# Patient Record
Sex: Female | Born: 1935 | Race: White | Hispanic: No | Marital: Married | Smoking: Never smoker
Health system: Southern US, Community
[De-identification: ages and names within clinical notes are randomized; demographics above are authoritative.]

## PROBLEM LIST (undated history)

## (undated) DIAGNOSIS — E079 Disorder of thyroid, unspecified: Secondary | ICD-10-CM

## (undated) DIAGNOSIS — H3530 Unspecified macular degeneration: Secondary | ICD-10-CM

## (undated) DIAGNOSIS — C801 Malignant (primary) neoplasm, unspecified: Secondary | ICD-10-CM

## (undated) DIAGNOSIS — E7800 Pure hypercholesterolemia, unspecified: Secondary | ICD-10-CM

## (undated) DIAGNOSIS — M1990 Unspecified osteoarthritis, unspecified site: Secondary | ICD-10-CM

## (undated) DIAGNOSIS — E039 Hypothyroidism, unspecified: Secondary | ICD-10-CM

## (undated) HISTORY — PX: INNER EAR SURGERY: SHX679

## (undated) HISTORY — PX: EYE SURGERY: SHX253

## (undated) HISTORY — PX: ABDOMINAL HYSTERECTOMY: SHX81

## (undated) HISTORY — PX: BREAST SURGERY: SHX581

---

## 1999-09-26 ENCOUNTER — Encounter: Payer: Self-pay | Admitting: Family Medicine

## 1999-09-26 ENCOUNTER — Encounter: Admission: RE | Admit: 1999-09-26 | Discharge: 1999-09-26 | Payer: Self-pay | Admitting: Family Medicine

## 2000-09-27 ENCOUNTER — Encounter: Payer: Self-pay | Admitting: Family Medicine

## 2000-09-27 ENCOUNTER — Encounter: Admission: RE | Admit: 2000-09-27 | Discharge: 2000-09-27 | Payer: Self-pay | Admitting: Family Medicine

## 2000-10-09 ENCOUNTER — Ambulatory Visit (HOSPITAL_COMMUNITY): Admission: RE | Admit: 2000-10-09 | Discharge: 2000-10-09 | Payer: Self-pay | Admitting: *Deleted

## 2001-09-29 ENCOUNTER — Encounter: Payer: Self-pay | Admitting: Family Medicine

## 2001-09-29 ENCOUNTER — Encounter: Admission: RE | Admit: 2001-09-29 | Discharge: 2001-09-29 | Payer: Self-pay | Admitting: Family Medicine

## 2001-10-02 ENCOUNTER — Encounter: Admission: RE | Admit: 2001-10-02 | Discharge: 2001-10-02 | Payer: Self-pay | Admitting: Family Medicine

## 2001-10-02 ENCOUNTER — Encounter: Payer: Self-pay | Admitting: Family Medicine

## 2002-05-15 ENCOUNTER — Encounter: Admission: RE | Admit: 2002-05-15 | Discharge: 2002-05-15 | Payer: Self-pay | Admitting: Family Medicine

## 2002-05-15 ENCOUNTER — Encounter: Payer: Self-pay | Admitting: Family Medicine

## 2002-10-07 ENCOUNTER — Encounter: Admission: RE | Admit: 2002-10-07 | Discharge: 2002-10-07 | Payer: Self-pay | Admitting: Family Medicine

## 2002-10-07 ENCOUNTER — Encounter: Payer: Self-pay | Admitting: Family Medicine

## 2003-10-08 ENCOUNTER — Encounter: Admission: RE | Admit: 2003-10-08 | Discharge: 2003-10-08 | Payer: Self-pay | Admitting: Family Medicine

## 2004-01-30 ENCOUNTER — Inpatient Hospital Stay (HOSPITAL_COMMUNITY): Admission: EM | Admit: 2004-01-30 | Discharge: 2004-02-02 | Payer: Self-pay | Admitting: Emergency Medicine

## 2004-02-01 ENCOUNTER — Encounter (INDEPENDENT_AMBULATORY_CARE_PROVIDER_SITE_OTHER): Payer: Self-pay | Admitting: Cardiology

## 2004-06-30 ENCOUNTER — Encounter: Admission: RE | Admit: 2004-06-30 | Discharge: 2004-06-30 | Payer: Self-pay | Admitting: Specialist

## 2004-07-03 ENCOUNTER — Encounter (INDEPENDENT_AMBULATORY_CARE_PROVIDER_SITE_OTHER): Payer: Self-pay | Admitting: Specialist

## 2004-07-03 ENCOUNTER — Ambulatory Visit (HOSPITAL_BASED_OUTPATIENT_CLINIC_OR_DEPARTMENT_OTHER): Admission: RE | Admit: 2004-07-03 | Discharge: 2004-07-03 | Payer: Self-pay | Admitting: Specialist

## 2004-07-03 ENCOUNTER — Ambulatory Visit (HOSPITAL_COMMUNITY): Admission: RE | Admit: 2004-07-03 | Discharge: 2004-07-03 | Payer: Self-pay | Admitting: Specialist

## 2004-10-13 ENCOUNTER — Encounter: Admission: RE | Admit: 2004-10-13 | Discharge: 2004-10-13 | Payer: Self-pay | Admitting: Family Medicine

## 2005-10-03 ENCOUNTER — Ambulatory Visit (HOSPITAL_COMMUNITY): Admission: RE | Admit: 2005-10-03 | Discharge: 2005-10-03 | Payer: Self-pay | Admitting: *Deleted

## 2005-10-03 ENCOUNTER — Encounter (INDEPENDENT_AMBULATORY_CARE_PROVIDER_SITE_OTHER): Payer: Self-pay | Admitting: *Deleted

## 2005-10-15 ENCOUNTER — Encounter: Admission: RE | Admit: 2005-10-15 | Discharge: 2005-10-15 | Payer: Self-pay | Admitting: Family Medicine

## 2006-10-17 ENCOUNTER — Encounter: Admission: RE | Admit: 2006-10-17 | Discharge: 2006-10-17 | Payer: Self-pay | Admitting: Family Medicine

## 2007-03-05 ENCOUNTER — Ambulatory Visit (HOSPITAL_COMMUNITY): Admission: RE | Admit: 2007-03-05 | Discharge: 2007-03-05 | Payer: Self-pay | Admitting: *Deleted

## 2007-03-05 ENCOUNTER — Encounter (INDEPENDENT_AMBULATORY_CARE_PROVIDER_SITE_OTHER): Payer: Self-pay | Admitting: *Deleted

## 2007-03-07 ENCOUNTER — Ambulatory Visit (HOSPITAL_COMMUNITY): Admission: RE | Admit: 2007-03-07 | Discharge: 2007-03-07 | Payer: Self-pay | Admitting: *Deleted

## 2007-10-20 ENCOUNTER — Encounter: Admission: RE | Admit: 2007-10-20 | Discharge: 2007-10-20 | Payer: Self-pay | Admitting: Family Medicine

## 2008-10-20 ENCOUNTER — Encounter: Admission: RE | Admit: 2008-10-20 | Discharge: 2008-10-20 | Payer: Self-pay | Admitting: Family Medicine

## 2009-10-31 ENCOUNTER — Encounter: Admission: RE | Admit: 2009-10-31 | Discharge: 2009-10-31 | Payer: Self-pay | Admitting: Family Medicine

## 2010-08-29 NOTE — Op Note (Signed)
[name], Kendra Owens               ACCOUNT NO.:  [id number]   MEDICAL RECORD NO.:  [id number]          PATIENT TYPE:  AMB   LOCATION:  ENDO                         FACILITY:  [facility]   PHYSICIAN:  Georgiana Spinner, M.D.    DATE OF BIRTH:  Sep 18, 1925   DATE OF PROCEDURE:  03/05/2007  DATE OF DISCHARGE:                               OPERATIVE REPORT   PROCEDURE:  Upper endoscopy.   INDICATIONS:  GI blood loss.   ANESTHESIA:  Fentanyl 50 mcg, Versed 3 mg.   PROCEDURE:  With the patient mildly sedated in the left lateral  decubitus position, the Pentax videoscopic endoscope was inserted and  passed under direct vision through the esophagus which appeared normal  until we reached distal esophagus and there was a questionable area  __________  of Barrett's esophagus up into the esophagus cephalad and  this was photographed and biopsied.  We entered into the stomach.  Fundus, body, antrum, duodenal bulb, second portion duodenum appeared  normal.  From this point the endoscope was slowly withdrawn taking  circumferential views of duodenal mucosa until the endoscope had been  pulled back into stomach placed in retroflexion to view the stomach from  below.  The endoscope was straightened and withdrawn taking  circumferential views remaining gastric and esophageal mucosa.  The  patient's vital signs and pulse oximeter remained stable.  The patient  tolerated procedure well without apparent complications.   FINDINGS:  Hiatal hernia with Barrett's esophagus biopsied but otherwise  an unremarkable examination.   PLAN:  Await biopsy report and proceed with capsule endoscopy and have  patient follow-up with me as an outpatient.           ______________________________  Georgiana Spinner, M.D.     GMO/MEDQ  D:  03/05/2007  T:  03/05/2007  Job:  [id number]   cc:   Tammy R. Collins Scotland, M.D.  Fax: [phone]

## 2010-09-01 NOTE — Op Note (Signed)
NAMEKITTI, MCCLISH NO.:  [id number]   MEDICAL RECORD NO.:  [id number]          PATIENT TYPE:  AMB   LOCATION:  ENDO                         FACILITY:  [facility]   PHYSICIAN:  Georgiana Spinner, M.D.    DATE OF BIRTH:  1925/11/30   DATE OF PROCEDURE:  10/03/2005  DATE OF DISCHARGE:                                 OPERATIVE REPORT   PROCEDURE:  Colonoscopy.   INDICATIONS:  Colon polyps.   ANESTHESIA:  None further given.   PROCEDURE:  With the patient mildly sedated in the left lateral decubitus  position, the Olympus videoscopic colonoscope was inserted in the rectum,  and with pressure applied to the abdomen, we reached the cecum identified by  ileocecal valve and appendiceal orifice.  Appendix appeared somewhat  retroverted, and this was photographed, and to be specific and clear, I  elected to do cold biopsy of this area.  From this point, the colonoscope  was then slowly withdrawn, taking circumferential views of colonic mucosa,  stopping only then in the rectum which appeared normal on direct and showed  hemorrhoids on retroflexed view.  The endoscope was straightened and  withdrawn.  The patient's vital signs and pulse oximeter remained stable.  The patient tolerated the procedure well without apparent complications.   FINDINGS:  Question of retroverted appendix versus polyp at the area of the  appendiceal orifice.  Await biopsy report.  The patient will call me for  results and follow-up with me as an outpatient.  See endoscopy note for  further details.           ______________________________  Georgiana Spinner, M.D.     GMO/MEDQ  D:  10/03/2005  T:  10/03/2005  Job:  [id number]

## 2010-09-01 NOTE — H&P (Signed)
NAMELAIKEN, SANDY NO.:  [id number]   MEDICAL RECORD NO.:  [id number]          PATIENT TYPE:  EMS   LOCATION:  MAJO                         FACILITY:  [facility]   PHYSICIAN:  Hillery Aldo, M.D.   DATE OF BIRTH:  1925/11/13   DATE OF ADMISSION:  01/30/2004  DATE OF DISCHARGE:                                HISTORY & PHYSICAL   CHIEF COMPLAINT:  Dizziness.   HISTORY OF PRESENT ILLNESS:  The patient is a 77 year old female with a past  medical history of vertigo who has had intermittent vertigo throughout her  life and who normally takes Dramamine for relief. She presents to the  hospital today with 24 to 36 hours of what started as  her usual vertigo,  but she became concerned when she developed some nausea and vomiting  associated with the vertigo. She has not had any relief from her usual dose  of Dramamine. The patient reports six episodes of emesis today. She denies  any associated chest pain, arrhythmia, shortness of breath, focal areas of  weakness, headache, dysarthria, or dysphagia. She initially presented to the  [facility] and during the evaluation there was found to have  sinus bradycardia with 42 beats per minute. She was sent to [facility] emergency department  for further evaluation and workup. She is  admitted today for rule out of a cardiac source for her current complaints  of vertigo.   PAST MEDICAL HISTORY:  1.  Vertigo.  2.  Hyperlipidemia.  3.  History of hysterectomy secondary to menorrhagia.  4.  History of ruptured  tympanic membrane and severe earaches as a child,      status post  tympanic membrane repair remotely.  5.  Decreased auditory acuity secondary to #4.  6.  Degenerative joint disease.   ALLERGIES:  PENICILLIN is voluntarily not taken secondary to personal choice  because she had experienced the death of a child secondary to complications  from a reaction to penicillin.   CURRENT MEDICATIONS:  1.   Lipitor 20 mg daily.  2.  Dramamine p.r.n.   SOCIAL HISTORY:  The patient is married and lives with her husband. She is  independent of all activities of daily living. She is a retired  Advertising account planner. She is a lifelong nonsmoker. She denies any  alcohol or drug use.   FAMILY HISTORY:  Father is deceased at age 52 secondary to complications of  COPD. The mother is deceased at age 40 secondary to colon cancer. She has  one sister who is in good health. She has two children also in good health.   REVIEW OF SYSTEMS:  CONSTITUTIONAL: No fever or chills. She does describe  intermittent hot flashes that occur with the dizzy spells. No weight loss.  HEENT: No diplopia or change in vision. She does have longstanding decreased  auditory acuity. No sore throat. CARDIOVASCULAR: No chest pain or  arrhythmia. RESPIRATORY: No shortness of breath or cough. GI: Nausea and  vomiting as per HPI. She uses stool softeners for lazy bowels. No melena  or hematochezia. She gets  colonoscopies regularly.  GU: No dysuria,  hematuria, frequency, or polyuria. MUSCULOSKELETAL:  The patient reports  occasional aches and pains secondary to arthritis. NEUROLOGIC: As per HPI.   PHYSICAL EXAMINATION:  VITAL SIGNS: Temperature 97.4, pulse 87, blood  pressure 114/65, respiratory rate 16, O2 saturation 96% on room air.  GENERAL: Well-developed, well-nourished female in moderate distress  secondary to ongoing vertigo.  HEENT: Normocephalic and atraumatic. Pupils are 3 mm and equally round,  reactive to light and accommodation. Extraocular movements are intact with  no nystagmus with no provoked extreme of lateral gaze. She did have some  occasional nystagmus without provoked gaze. Oropharynx is clear. She has dry  mucous membranes.  NECK: Supple, no thyromegaly, no lymphadenopathy, no jugular venous  distention or carotid bruits.  CHEST: Lungs clear to auscultation bilaterally with good air movement.  HEART:  Regular rate and rhythm. No murmurs, rubs, or gallops. Normal S1 and  S2.  ABDOMEN: Soft, nontender, nondistended with normoactive bowel sounds. No  organomegaly.  EXTREMITIES: No clubbing, cyanosis, or edema. There are 2+ dorsalis pedis  pulses bilaterally.  NEUROLOGIC: The patient is alert and oriented times three. Cranial nerves II-  XII are intact. She has normal strength in both upper and lower extremities.  She has 2+ symmetric reflexes in both upper and lower extremities. Toes are  downgoing bilaterally. Neurologic exam is nonfocal.   LABORATORY EVALUATION:  CBC shows a white blood cell count of 5.3,  hemoglobin 12.2, hematocrit 34.9, platelet count 112,000, MCV 89.5.  Chemistry showed a sodium of 142, potassium 4.5, chloride 111, bicarbonate  25, BUN 18, creatinine 1.0, glucose 107, calcium 9.1, total protein 6.2,  albumin 3.5, AST 24,  ALT 22, alkaline phosphatase 38, total bilirubin 0.8.  Urinalysis was negative except for small amount of leukocyte esterase.  Myoglobin 103, CK-MB 101, troponin less than 0.05.   RADIOGRAPHIC DATA:  CT scan of the head showed no acute findings. EKG showed  ST-segment depression in the inferolateral leads, normal sinus rhythm at 79  beats per minute. There are no old EKG tracings to compare.   ASSESSMENT/PLAN:  1.  Vertigo: Given the patient's history of present illness, this sounds      like a flair of her usual vertigo. I will treat her conservatively with      Valium for the vertigo and antiemetics for the nausea. The CT scan of      the head is negative for cerebellar pontine angle tumors.  Although I      doubt that this is a primarily a cardiovascular problem, given her      bradycardia that was noted at the urgent care, I think it is reasonable      to rule out acute coronary syndrome with serial enzymes and EKG. I will      place her empirically on aspirin. At this point, I doubt a stroke workup     would be helpful, but would consider  obtaining an MRA, MRI, 2-D      echocardiogram, and carotid Dopplers if her symptoms do not resolve with      conservative management. Additionally, she will be placed on fall      precautions and monitored on telemetry.  2.  Hyperlipidemia: We will check a fasting lipid panel and continue her      usual home dose of her Lipitor.  3.  Prophylaxis: The patient will be empirically treated with a  proton pump      inhibitor and  Lovenox to prevent GI ulceration and DVT.       CR/MEDQ  D:  01/30/2004  T:  01/30/2004  Job:  [id number]   cc:   Tammy R. Collins Scotland, M.D.  [address]  [place]  [place] [address]  Fax: [phone]

## 2010-09-01 NOTE — Procedures (Signed)
[facility]  Patient:    Kendra Owens, Kendra Owens                         MRN: [id number]. Date: 10/09/00 Attending:  Sabino Gasser, M.D.                           Procedure Report  PROCEDURE:  Colonoscopy.  INDICATIONS:  Colon polyps.  ANESTHESIA:  Demerol 50 mg, Versed 5 mg.  DESCRIPTION OF PROCEDURE:  With the patient mildly sedated in the left lateral decubitus position, the Olympus variable stiffness colonoscope was inserted into the rectum and passed under direct vision to the cecum, identified by the ileocecal valve and appendiceal orifice.  From this point, the colonoscope was slowly withdrawn taking circumferential views of the entire colonic mucosa stopping only then in the rectum which appeared normal on direct view and showed some small internal hemorrhoids on retroflexed view.  The endoscope was straightened and withdrawn.  The patients vital signs and pulse oximeter remained stable.  The patient tolerated the procedure well without apparent complications.  FINDINGS:  Internal hemorrhoids.  Otherwise unremarkable examination.  PLAN:  Repeat examination in five years. DD:  10/09/00 TD:  10/09/00 Job: [id number]

## 2010-09-01 NOTE — Op Note (Signed)
NAMESPARROW, SIRACUSA               ACCOUNT NO.:  [id number]   MEDICAL RECORD NO.:  [id number]          PATIENT TYPE:  AMB   LOCATION:  DSC                          FACILITY:  [facility]   PHYSICIAN:  Earvin Hansen L. Truesdale, M.D.DATE OF BIRTH:  1925-06-18   DATE OF PROCEDURE:  07/03/2004  DATE OF DISCHARGE:                                 OPERATIVE REPORT   HISTORY OF PRESENT ILLNESS:  This 77 year old lady has increased  blepharochalasis causing increased  heaviness of orbicularis,  headaches as  well as peripheral vision restrictions, right side greater than the left.  The patient states that she has used all types of drops in her eyes to  improve the areas with no avail.  Previous photographs were done,  preoperatively as well as visional field tests showing her severe  blepharochalasis.  Since the patient has failed conservative treatment for  this she is now being prepared for upper lid blepharoplasties to relieve the  symptoms.  All the procedures were explained to her in detail preoperatively  as well as attendant risks and possible complications.  She understands and  consents to surgery.   PROCEDURE:  Upper lid blepharoplasties.   ANESTHESIA:  Sedation MAC supplemented by 1% Xylocaine with epinephrine  1:100,000 concentration total of 10 cubic centimeters.   DESCRIPTION OF PROCEDURE:  The patient was taken the operating room table in  the supine position.  Was given adequate MAC anesthesia.  Preoperative was  done to the face and neck areas with Betadine solution, walled off with  sterile towels and draped to make a sterile field.  Saline was then used to  wipe down everything.  The eyes were protected with Lacri-Lube.  Preoperatively the patient had been set up and drawn for the supratarsal  fold.  Using the calibers we measured out approximately 1 cm to the mid  point and drew the rest of the drawings congruent with the excess skin.  It  was noticed that she had much more on the  right side laterally than on the  left side.  After this 1% Xylocaine was injected with epinephrine.  This  allowed to sit at least five minutes.  The incisions were made with  #15  blade removing the excess skin and out laterally some of the deeper tissue  down to the muscle.  A strip of orbicularis oculus was taken across the  whole supratarsal fold area.  The septum orbitale was entered.  Excess fat  pads removed from the upper lids as well as the medial compartments.  After  proper hemostasis the skin was then rearranged and reapproximated with a  running subcuticular stitch of 5-0 nylon from the medial canthal area over  to the lateral portion.  The rest of the lateral area was closed with  multiple sutures of 6-0 silk.  The same procedure was carried out on both  sides.  After being happy with the results the wounds were cleansed, the  eyes were irrigated with balanced salt solution.  Polysporin was placed over  the incisions and wet, cool compresses.  She withstood the procedures  very  well.  Estimated blood loss less than 20 cubic centimeters.   COMPLICATIONS:  None.      GLT/MEDQ  D:  07/03/2004  T:  07/03/2004  Job:  [id number]

## 2010-09-01 NOTE — Op Note (Signed)
NAMEPHILENA, OBEY NO.:  [id number]   MEDICAL RECORD NO.:  [id number]          PATIENT TYPE:  AMB   LOCATION:  ENDO                         FACILITY:  [facility]   PHYSICIAN:  Georgiana Spinner, M.D.    DATE OF BIRTH:  Jun 23, 1925   DATE OF PROCEDURE:  DATE OF DISCHARGE:                                 OPERATIVE REPORT   PROCEDURE:  Upper endoscopy.   INDICATIONS:  GERD.   ANESTHESIA:  Demerol 40, Versed 4 mg.   PROCEDURE:  With the patient mildly sedated in the left lateral decubitus  position, the Olympus videoscopic endoscope was inserted into the mouth and  passed under direct vision through the esophagus which appeared normal until  we reached the distal most esophagus and there were changes of esophagitis  and/or Barrett's esophagus.  Photographs and biopsies were taken.  We  entered into the stomach through a hiatal hernia.  Fundus, body, antrum,  duodenal bulb, second portion of duodenum were visualized and appeared  normal.  From this point, the endoscope was slowly withdrawn, taken  circumferential views of the duodenal mucosa until the endoscope had been  pulled back in the stomach and placed in retroflexion and viewed the stomach  from below.  The endoscope was then straightened and withdrawn, taking  circumferential views of the remaining gastric and esophageal mucosa.  The  patient's vital signs and pulse oximetry remained stable.  The patient  tolerated the procedure well without apparent complications.   FINDINGS:  Changes of esophagitis and/or Barrett's esophagus with biopsy.  Await biopsy reports.  The patient will call me for results and follow up  with me as an outpatient.  Proceed to colonoscopy as planned.           ______________________________  Georgiana Spinner, M.D.     GMO/MEDQ  D:  10/03/2005  T:  10/03/2005  Job:  [id number]

## 2010-09-01 NOTE — Discharge Summary (Signed)
Kendra Owens, Kendra Owens               ACCOUNT NO.:  [id number]   MEDICAL RECORD NO.:  [id number]          PATIENT TYPE:  INP   LOCATION:  4734                         FACILITY:  [facility]   PHYSICIAN:  Mobolaji B. Bakare, M.D.DATE OF BIRTH:  11-Sep-1925   DATE OF ADMISSION:  01/30/2004  DATE OF DISCHARGE:  02/02/2004                                 DISCHARGE SUMMARY   FINAL DIAGNOSES:  1.  Vertigo.  2.  Supraventricular tachycardia.  3.  Bradycardia.  4.  Thrombocytopenia.   SECONDARY DIAGNOSES:  1.  Hyperlipidemia.   PRIMARY CARE PHYSICIAN:  Tammy R. Collins Scotland, M.D.   CARDIOLOGIST:  Thereasa Solo. Little, M.D.   CHIEF COMPLAINT:  Dizziness.   BRIEF HISTORY:  Kendra Owens is a 77 year old female who has a past history  of vertigo.  She was admitted complaining of dizziness.  With typical  symptoms compatible with previous episodes of vertigo she has always had  vertigo all her life for which she uses Dramamine, but in this instance she  did not get relief from Dramamine.  There was associated emesis, however, no  other symptoms associated with these complaints.   Please refer to admission H&P for further history.   PERTINENT PHYSICAL FINDINGS:  On initial admission, vital signs:  Temperature of 97.4, pulse of 87, blood pressure of 114/65.  Respiratory  rate is 16.  O2 saturation is 96% on room air.  HEENT:  Pupils are equal and  nonreactive to light.  Extraocular muscle movements are intact with some  occasional nystagmus.  She was alert, oriented to time, place and person.  Neurological examination:  Cranial nerves were all intact and no  neurological deficits.  The rest of the systems were within normal.   PERTINENT LABORATORY FINDINGS:  CBC 5.3, hemoglobin 12.2, hematocrit 34.9,  platelets 112.  Sodium 142, potassium 4.5, chloride 111, bicarb 25, BUN 18,  creatinine 1.9, glucose 107, calcium 9.1, total protein 6.2, albumin 3.5,  AST 24, ALT 22, alkaline phosphatase 58, total bilirubin  0.8.  Urinalysis  was negative except for small amounts of leukocyte esterase.  TSH was 3.265,  normal.  Total cholesterol 41, HDL 59, LDL 70, triglycerides 62.  EKG showed  sinus bradycardia with a heart rate of 42 beats per minute and nonspecific  ST segment abnormality in V4 to V6.  A CT scan was normal.  No acute  findings.  A 2D echocardiogram showed normal left ventricular ejection  fraction of 55 to 65.  No significant valvular lesion.   HOSPITAL COURSE:  The patient was treated with Valium and Phenergan p.r.n.  and the vertigo subsequently resolved.  She was evaluated with telemetry and  on telemetry she was noted to have SVT with heart rate of 140.  This was  also evaluated by the cardiologist.  She only had one episode of a rhythm  strip with supraventricular tachycardia and it was felt that this may be  part of a sick sinus syndrome/tachy/brady syndrome.  She would follow up  with Dr. Clarene Duke in the office.  At this point, no recommendation for a rate  controlling medication because most times she is in sinus bradycardia.  She  did well and she was ambulated without difficulty by the physical therapist  and it was felt that the patient was stable to go home.  It was also noted  that initial blood work revealed mild thrombocytopenia.   DISCHARGE INSTRUCTIONS:  The patient should continue with home medications  and use Dramamine as needed for dizziness.  She should also use baby aspirin  once a day.   FOLLOW UP PLANS:  She should follow up with Dr. Clarene Duke, cardiologist in the  office as scheduled and she should schedule an appointment with ear, nose  and throat specialist for follow up of the vertigo and evaluation.  She  should follow up with Dr. Collins Scotland in 1 to 2 weeks' time and call for an  appointment.       MBB/MEDQ  D:  02/05/2004  T:  02/05/2004  Job:  [id number]   cc:   Tammy R. Collins Scotland, M.D.  [address]  [place]  [place] [address]  Fax: [phone]   Thereasa Solo.  Little, M.D.  [address]  [address]  [place]  [place] [address]  Fax: [phone]

## 2010-09-29 ENCOUNTER — Other Ambulatory Visit: Payer: Self-pay | Admitting: Family Medicine

## 2010-09-29 DIAGNOSIS — Z1231 Encounter for screening mammogram for malignant neoplasm of breast: Secondary | ICD-10-CM

## 2010-11-06 ENCOUNTER — Ambulatory Visit
Admission: RE | Admit: 2010-11-06 | Discharge: 2010-11-06 | Disposition: A | Discharge status: Home / Self Care | Payer: BC Managed Care – PPO | Source: Ambulatory Visit | Attending: Family Medicine | Admitting: Family Medicine

## 2010-11-06 DIAGNOSIS — Z1231 Encounter for screening mammogram for malignant neoplasm of breast: Secondary | ICD-10-CM

## 2010-11-07 ENCOUNTER — Ambulatory Visit: Payer: Self-pay

## 2011-10-08 ENCOUNTER — Other Ambulatory Visit: Payer: Self-pay | Admitting: Family Medicine

## 2011-10-08 DIAGNOSIS — Z1231 Encounter for screening mammogram for malignant neoplasm of breast: Secondary | ICD-10-CM

## 2011-11-09 ENCOUNTER — Ambulatory Visit
Admission: RE | Admit: 2011-11-09 | Discharge: 2011-11-09 | Disposition: A | Discharge status: Home / Self Care | Payer: BC Managed Care – PPO | Source: Ambulatory Visit | Attending: Family Medicine | Admitting: Family Medicine

## 2011-11-09 DIAGNOSIS — Z1231 Encounter for screening mammogram for malignant neoplasm of breast: Secondary | ICD-10-CM

## 2012-06-11 ENCOUNTER — Ambulatory Visit (INDEPENDENT_AMBULATORY_CARE_PROVIDER_SITE_OTHER): Payer: 59 | Admitting: *Deleted

## 2012-06-11 DIAGNOSIS — R0989 Other specified symptoms and signs involving the circulatory and respiratory systems: Secondary | ICD-10-CM

## 2013-05-25 ENCOUNTER — Emergency Department (HOSPITAL_COMMUNITY): Payer: Medicare Other

## 2013-05-25 ENCOUNTER — Encounter (HOSPITAL_COMMUNITY): Payer: Self-pay | Admitting: Emergency Medicine

## 2013-05-25 ENCOUNTER — Emergency Department (HOSPITAL_COMMUNITY)
Admission: EM | Admit: 2013-05-25 | Discharge: 2013-05-25 | Disposition: A | Discharge status: Home / Self Care | Payer: Medicare Other | Attending: Emergency Medicine | Admitting: Emergency Medicine

## 2013-05-25 DIAGNOSIS — Z792 Long term (current) use of antibiotics: Secondary | ICD-10-CM | POA: Insufficient documentation

## 2013-05-25 DIAGNOSIS — Z79899 Other long term (current) drug therapy: Secondary | ICD-10-CM | POA: Insufficient documentation

## 2013-05-25 DIAGNOSIS — R509 Fever, unspecified: Secondary | ICD-10-CM | POA: Insufficient documentation

## 2013-05-25 DIAGNOSIS — E079 Disorder of thyroid, unspecified: Secondary | ICD-10-CM | POA: Insufficient documentation

## 2013-05-25 DIAGNOSIS — B349 Viral infection, unspecified: Secondary | ICD-10-CM

## 2013-05-25 DIAGNOSIS — R630 Anorexia: Secondary | ICD-10-CM | POA: Insufficient documentation

## 2013-05-25 DIAGNOSIS — R059 Cough, unspecified: Secondary | ICD-10-CM | POA: Insufficient documentation

## 2013-05-25 DIAGNOSIS — R52 Pain, unspecified: Secondary | ICD-10-CM | POA: Insufficient documentation

## 2013-05-25 DIAGNOSIS — E7800 Pure hypercholesterolemia, unspecified: Secondary | ICD-10-CM | POA: Insufficient documentation

## 2013-05-25 DIAGNOSIS — R05 Cough: Secondary | ICD-10-CM | POA: Insufficient documentation

## 2013-05-25 DIAGNOSIS — B9789 Other viral agents as the cause of diseases classified elsewhere: Secondary | ICD-10-CM | POA: Insufficient documentation

## 2013-05-25 DIAGNOSIS — E860 Dehydration: Secondary | ICD-10-CM

## 2013-05-25 DIAGNOSIS — N390 Urinary tract infection, site not specified: Secondary | ICD-10-CM

## 2013-05-25 DIAGNOSIS — R0989 Other specified symptoms and signs involving the circulatory and respiratory systems: Secondary | ICD-10-CM | POA: Insufficient documentation

## 2013-05-25 DIAGNOSIS — Z8669 Personal history of other diseases of the nervous system and sense organs: Secondary | ICD-10-CM | POA: Insufficient documentation

## 2013-05-25 HISTORY — DX: Disorder of thyroid, unspecified: E07.9

## 2013-05-25 HISTORY — DX: Unspecified macular degeneration: H35.30

## 2013-05-25 HISTORY — DX: Pure hypercholesterolemia, unspecified: E78.00

## 2013-05-25 LAB — COMPREHENSIVE METABOLIC PANEL
ALT: 20 U/L (ref 0–35)
AST: 40 U/L — AB (ref 0–37)
Albumin: 3.4 g/dL — ABNORMAL LOW (ref 3.5–5.2)
Alkaline Phosphatase: 38 U/L — ABNORMAL LOW (ref 39–117)
BUN: 30 mg/dL — AB (ref 6–23)
CALCIUM: 8.9 mg/dL (ref 8.4–10.5)
CO2: 21 mEq/L (ref 19–32)
CREATININE: 1.19 mg/dL — AB (ref 0.50–1.10)
Chloride: 95 mEq/L — ABNORMAL LOW (ref 96–112)
GFR, EST AFRICAN AMERICAN: 47 mL/min — AB (ref >90)
GFR, EST NON AFRICAN AMERICAN: 40 mL/min — AB (ref >90)
Glucose, Bld: 95 mg/dL (ref 70–99)
Potassium: 4 mEq/L (ref 3.7–5.3)
SODIUM: 131 meq/L — AB (ref 137–147)
TOTAL PROTEIN: 7.1 g/dL (ref 6.0–8.3)
Total Bilirubin: 0.2 mg/dL — ABNORMAL LOW (ref 0.3–1.2)

## 2013-05-25 LAB — URINALYSIS, ROUTINE W REFLEX MICROSCOPIC
BILIRUBIN URINE: NEGATIVE
GLUCOSE, UA: NEGATIVE mg/dL
Hgb urine dipstick: NEGATIVE
KETONES UR: NEGATIVE mg/dL
Nitrite: NEGATIVE
PH: 5.5 (ref 5.0–8.0)
Protein, ur: 30 mg/dL — AB
SPECIFIC GRAVITY, URINE: 1.024 (ref 1.005–1.030)
Urobilinogen, UA: 0.2 mg/dL (ref 0.0–1.0)

## 2013-05-25 LAB — BASIC METABOLIC PANEL
BUN: 28 mg/dL — ABNORMAL HIGH (ref 6–23)
CHLORIDE: 101 meq/L (ref 96–112)
CO2: 22 meq/L (ref 19–32)
Calcium: 8 mg/dL — ABNORMAL LOW (ref 8.4–10.5)
Creatinine, Ser: 1.07 mg/dL (ref 0.50–1.10)
GFR calc non Af Amer: 46 mL/min — ABNORMAL LOW (ref >90)
GFR, EST AFRICAN AMERICAN: 53 mL/min — AB (ref >90)
Glucose, Bld: 109 mg/dL — ABNORMAL HIGH (ref 70–99)
POTASSIUM: 3.1 meq/L — AB (ref 3.7–5.3)
Sodium: 136 mEq/L — ABNORMAL LOW (ref 137–147)

## 2013-05-25 LAB — CBC WITH DIFFERENTIAL/PLATELET
Basophils Absolute: 0 10*3/uL (ref 0.0–0.1)
Basophils Relative: 0 % (ref 0–1)
Eosinophils Absolute: 0 10*3/uL (ref 0.0–0.7)
Eosinophils Relative: 0 % (ref 0–5)
HCT: 36.4 % (ref 36.0–46.0)
Hemoglobin: 12.6 g/dL (ref 12.0–15.0)
Lymphocytes Relative: 28 % (ref 12–46)
Lymphs Abs: 1.3 10*3/uL (ref 0.7–4.0)
MCH: 30.5 pg (ref 26.0–34.0)
MCHC: 34.6 g/dL (ref 30.0–36.0)
MCV: 88.1 fL (ref 78.0–100.0)
MONO ABS: 0.7 10*3/uL (ref 0.1–1.0)
MONOS PCT: 14 % — AB (ref 3–12)
NEUTROS ABS: 2.8 10*3/uL (ref 1.7–7.7)
NEUTROS PCT: 58 % (ref 43–77)
PLATELETS: 100 10*3/uL — AB (ref 150–400)
RBC: 4.13 MIL/uL (ref 3.87–5.11)
RDW: 13.6 % (ref 11.5–15.5)
WBC: 4.8 10*3/uL (ref 4.0–10.5)

## 2013-05-25 LAB — CBC
HEMATOCRIT: 34.2 % — AB (ref 36.0–46.0)
Hemoglobin: 11.5 g/dL — ABNORMAL LOW (ref 12.0–15.0)
MCH: 29.6 pg (ref 26.0–34.0)
MCHC: 33.6 g/dL (ref 30.0–36.0)
MCV: 88.1 fL (ref 78.0–100.0)
PLATELETS: 87 10*3/uL — AB (ref 150–400)
RBC: 3.88 MIL/uL (ref 3.87–5.11)
RDW: 13.8 % (ref 11.5–15.5)
WBC: 3.4 10*3/uL — AB (ref 4.0–10.5)

## 2013-05-25 LAB — URINE MICROSCOPIC-ADD ON

## 2013-05-25 LAB — TROPONIN I
Troponin I: <0.3 ng/mL (ref <0.30)
Troponin I: <0.3 ng/mL (ref <0.30)

## 2013-05-25 LAB — CG4 I-STAT (LACTIC ACID): LACTIC ACID, VENOUS: 1.11 mmol/L (ref 0.5–2.2)

## 2013-05-25 MED ORDER — CIPROFLOXACIN HCL 500 MG PO TABS: 500.0000 mg | ORAL_TABLET | Freq: Two times a day (BID) | ORAL | Status: DC

## 2013-05-25 MED ORDER — DEXTROSE 5 % IV SOLN
1.0000 g | Freq: Once | INTRAVENOUS | Status: AC
  Administered 2013-05-25: 1 g via INTRAVENOUS
  Filled 2013-05-25: qty 10

## 2013-05-25 MED ORDER — POTASSIUM CHLORIDE CRYS ER 20 MEQ PO TBCR
40.0000 meq | EXTENDED_RELEASE_TABLET | Freq: Once | ORAL | Status: AC
  Administered 2013-05-25: 40 meq via ORAL
  Filled 2013-05-25: qty 2

## 2013-05-25 MED ORDER — SODIUM CHLORIDE 0.9 % IV BOLUS (SEPSIS)
1000.0000 mL | Freq: Once | INTRAVENOUS | Status: AC
  Administered 2013-05-25: 1000 mL via INTRAVENOUS

## 2013-05-25 MED ORDER — POTASSIUM CHLORIDE CRYS ER 20 MEQ PO TBCR: 20.0000 meq | EXTENDED_RELEASE_TABLET | Freq: Every day | ORAL | Status: DC

## 2013-05-25 NOTE — Progress Notes (Signed)
   CARE MANAGEMENT ED NOTE 05/25/2013  Patient:  Kendra Owens, Kendra Owens   Account Number:  [id number]  Date Initiated:  05/25/2013  Documentation initiated by:  Livia Snellen  Subjective/Objective Assessment:   Patient presents to ED sent in from pcp office for questionable dehydration.     Subjective/Objective Assessment Detail:   Patient withhistory of thyroid disease high cholesterol and macular degeneration.     Action/Plan:   Action/Plan Detail:   Anticipated DC Date:       Status Recommendation to Physician:   Result of Recommendation:    Other ED Services  Consult Working [place]  Other    Choice offered to / List presented to:            Status of service:  Completed, signed off  ED Comments:   ED Comments Detail:  EDCM spoke to patient at bedside.  Patient lives at home by herself.  Patient's daughter Rose Fillers [phone] lives right next door to patient.  Patient does not have any medical equipment at home.  Patient reports she has had [place] come out for her husband when he was sick.  Patient reports she can perform her own ADL's without difficulty.  Patient reports she does all of her own yard work as well.  Patient confirms he pcp is Dr. Charleston Poot.  EDCM provided patient with a list of home health agencies in [facility]  Also provided patient with a list of private duty nursing services.  Patient and patient's daughter both agree that patient will not be requiring home health services at this  time.  Patient thankful for resources.  No further EDCM needs at thiss time.

## 2013-05-25 NOTE — ED Notes (Signed)
Pt to ED from PCP office with dehydration.  Pt reports flu since last Thursday 05/21/13.  Pt daughter states PCP recommended pt to come to ED for IV fluids and a chest xray.  Denies chest pain, SOB, or fever.  Pt c/o generalized weakness.

## 2013-05-25 NOTE — Discharge Instructions (Signed)
Please call your doctor for a followup appointment within 24-48 hours. When you talk to your doctor please let them know that you were seen in the emergency department and have them acquire all of your records so that they can discuss the findings with you and formulate a treatment plan to fully care for your new and ongoing problems. Please call primary care provider first thing tomorrow morning to be re-assessed. Must be re-assessed by primary doctor within 24-48 hours. Blood work and urine will need to be re-checked Please rest and stay hydrated Please take medications as prescribed - Ciprofloxacin for urinary tract infection Please drink plenty of fluids Please continue to monitor symptoms closely and if symptoms are to worsen or change (fever greater than 101, chills, sweating, nausea, vomiting, diarrhea, stomach pain, chest pain, shortness of breath, difficulty breathing, urine decreased, inability to tolerate fluids) please report back to the ED immediately  Urinary Tract Infection Urinary tract infections (UTIs) can develop anywhere along your urinary tract. Your urinary tract is your body's drainage system for removing wastes and extra water. Your urinary tract includes two kidneys, two ureters, a bladder, and a urethra. Your kidneys are a pair of bean-shaped organs. Each kidney is about the size of your fist. They are located below your ribs, one on each side of your spine. CAUSES Infections are caused by microbes, which are microscopic organisms, including fungi, viruses, and bacteria. These organisms are so small that they can only be seen through a microscope. Bacteria are the microbes that most commonly cause UTIs. SYMPTOMS  Symptoms of UTIs may vary by age and gender of the patient and by the location of the infection. Symptoms in young women typically include a frequent and intense urge to urinate and a painful, burning feeling in the bladder or urethra during urination. Older women and  men are more likely to be tired, shaky, and weak and have muscle aches and abdominal pain. A fever may mean the infection is in your kidneys. Other symptoms of a kidney infection include pain in your back or sides below the ribs, nausea, and vomiting. DIAGNOSIS To diagnose a UTI, your caregiver will ask you about your symptoms. Your caregiver also will ask to provide a urine sample. The urine sample will be tested for bacteria and white blood cells. White blood cells are made by your body to help fight infection. TREATMENT  Typically, UTIs can be treated with medication. Because most UTIs are caused by a bacterial infection, they usually can be treated with the use of antibiotics. The choice of antibiotic and length of treatment depend on your symptoms and the type of bacteria causing your infection. HOME CARE INSTRUCTIONS  If you were prescribed antibiotics, take them exactly as your caregiver instructs you. Finish the medication even if you feel better after you have only taken some of the medication.  Drink enough water and fluids to keep your urine clear or pale yellow.  Avoid caffeine, tea, and carbonated beverages. They tend to irritate your bladder.  Empty your bladder often. Avoid holding urine for long periods of time.  Empty your bladder before and after sexual intercourse.  After a bowel movement, women should cleanse from front to back. Use each tissue only once. SEEK MEDICAL CARE IF:   You have back pain.  You develop a fever.  Your symptoms do not begin to resolve within 3 days. SEEK IMMEDIATE MEDICAL CARE IF:   You have severe back pain or lower abdominal pain.  You develop chills.  You have nausea or vomiting.  You have continued burning or discomfort with urination. MAKE SURE YOU:   Understand these instructions.  Will watch your condition.  Will get help right away if you are not doing well or get worse. Document Released: 01/10/2005 Document Revised:  10/02/2011 Document Reviewed: 05/11/2011 [facility] Patient Information 2014 [place].   Emergency Department Resource Guide 1) Find a Doctor and Pay Out of Pocket Although you won't have to find out who is covered by your insurance plan, it is a good idea to ask around and get recommendations. You will then need to call the office and see if the doctor you have chosen will accept you as a new patient and what types of options they offer for patients who are self-pay. Some doctors offer discounts or will set up payment plans for their patients who do not have insurance, but you will need to ask so you aren't surprised when you get to your appointment.  2) Contact Your Local Health Department Not all health departments have doctors that can see patients for sick visits, but many do, so it is worth a call to see if yours does. If you don't know where your local health department is, you can check in your phone book. The CDC also has a tool to help you locate your state's health department, and many state websites also have listings of all of their local health departments.  3) Find a [facility] If your illness is not likely to be very severe or complicated, you may want to try a walk in clinic. These are popping up all over the country in pharmacies, drugstores, and shopping centers. They're usually staffed by nurse practitioners or physician assistants that have been trained to treat common illnesses and complaints. They're usually fairly quick and inexpensive. However, if you have serious medical issues or chronic medical problems, these are probably not your best option.  No Primary Care Doctor: - Call Health Connect at  [phone] - they can help you locate a primary care doctor that  accepts your insurance, provides certain services, etc. - Physician Referral Service- [phone]  Chronic Pain Problems: Organization         Address  Phone   Notes  [facility]   [phone] Patients need to be referred by their primary care doctor.   Medication Assistance: Organization         Address  Phone   Notes  [facility] Medication [facility] [address] [place], [place] [address] [phone] --Must be a resident of [facility] -- Must have NO insurance coverage whatsoever (no Medicaid/ Medicare, etc.) -- The pt. MUST have a primary care doctor that directs their care regularly and follows them in the community   [facility]ist  [phone]   [employer]  [phone]    Agencies that provide inexpensive medical care: Organization         Address  Phone   Notes  [facility]  [phone]   [facility]    [phone]   [facility] [place],  [phone]   [place] [address] [place] [phone]   Planned Parenthood    [phone]   [facility]    [phone]   [place] and [place] [address] [place], [employer] Phone:  [phone], Fax:  [phone] Hours of Operation:  9 am - 6 pm, M-F.  Also accepts Medicaid/Medicare and self-pay.  [facility]  [address], [place] Phone: [phone], Fax: [phone]. Hours of Operation:  8:30 am - 5:30 pm, M-F.  Also accepts Medicaid and self-pay.  [facility] [place] [address], [place] Phone: [phone]   [facility] [address] Natasha Bence [place], [place] [phone], Ext. [phone] Mondays & Thursdays: 7-9 AM.  First 15 patients are seen on a first come, first serve basis.    Medicaid-accepting [facility] Providers:  Organization         Address  Phone   Notes  [facility] [place] [facility] [address], [place] [phone] Also accepts self-pay patients.  [facility] [address] Laurell Josephs [place],  [place]  [phone]   [facility] [address], [place] [phone]   [facility] [address], [place] [phone]   Renaye Rakers [address], [place]   [phone] Only accepts [place] Access [place] patients after they have their name applied to their card.   Self-Pay (no insurance) in [facility]:  Organization         Address  Phone   Notes  Sickle Cell Patients, [facility] [address] [place], [place] [phone]   [facility] [address] [place], [place] [phone]   [facility] [place]  [address], [place] [phone]   [facility]/Dr. Osei-Bonsu  [address], [place] or [address], [place] [phone] Phone number for both [place] and [place] locations is the same.  [facility] [address], [place] [phone]   [facility] [address] [place] or [address] [phone]   [facility] [address], [place] [phone], phone; [phone], fax Sees patients 1st and 3rd Saturday of every month.  Must not qualify for public or private insurance (i.e. Medicaid, Medicare, [place] Health Choice, Veterans' Benefits)  Household income should be no more than 200% of the poverty level The clinic cannot treat you if you are pregnant or think you are pregnant  Sexually transmitted diseases are not treated at the clinic.    Dental Care: Organization         Address  Phone  Notes  [facility] of [facility] [address] [place], [place] [phone] Accepts children up to age 17 who are enrolled in [place] or [place] Health Choice; pregnant women with a Medicaid card; and children who have applied for Medicaid or [place] Health  Choice, but were declined, whose parents can pay a reduced fee at time of service.  [facility]  [address], [place] [phone] Accepts children up to age 48 who are enrolled in [place] or [place] Health Choice; pregnant women with a Medicaid card; and children who have applied for Medicaid or [place] Health Choice, but were declined, whose parents can pay a reduced fee at time of service.  [facility]  [address] [place], [place] [phone] Patients are seen by appointment only. Walk-ins  are not accepted. [facility] will see patients 78 years of age and older. Monday - Tuesday (8am-5pm) Most Wednesdays (8:30-5pm) $30 per visit, cash only  [facility]  [address], [facility] [phone] Patients are seen by appointment only. Walk-ins are not accepted. [facility] will see patients 78 years of age and older. One Wednesday Evening (Monthly: Volunteer Based).  $30 per visit, cash only  [employer] [facility]  [phone] for adults; Children under age 124, call Graduate Pediatric Dentistry at [phone]. Children aged 664-14, please call [phone] to request a pediatric application.  Dental services are provided in all areas of dental care including fillings, crowns and bridges, complete and partial dentures, implants, gum treatment, root canals, and extractions. Preventive care is also provided. Treatment is provided to both adults and children. Patients are selected via a lottery and there is often a waiting list.   [facility] [address], [place]  [phone] [email or web address]   [facility] [address], [place], [place] [phone], Ext. [phone] Second and Fourth Thursday of each month, opens at 6:30 AM; Clinic ends at 9 AM.  Patients are seen on a first-come first-served basis, and a limited number are seen during each  clinic.   [facility]  [address], [place], [place] [phone]   Eligibility Requirements You must have lived in [place], [place], or [place] counties for at least the last three months.   You cannot be eligible for state or federal sponsored [place] Cityhealthcare insurance, including CIG[employer]Veterans Administration, [place]icaid, or [employer]icare.   You generally cannot be eligible for healthcare insurance through your employer.    How to apply: Eligibility screenings are held every Tuesday and Wednesday afternoon from 1:00 pm until 4:00 pm. You do not need an appointment for the interview!  [facility] [address], [place], [place] [phone]   [facility]  [phone]   [facility]  [phone]   [facility]  [phone]    Behavioral Health Resources in the Community: Intensive Outpatient Programs Organization         Address  Phone  Notes  [facility] [address], [place], [place] [phone]   [facility] [address], [place], [place] [phone]   [facility] [address], [place], [place]  [phone]   [facility] [address],  [place], [place] [phone] or [phone]   Substance Abuse Resources Organization         Address  Phone  Notes  [facility]  [phone]   [facility]  [phone]   [facility]  [phone]   [facility]  [phone]   Residential & Outpatient Substance Abuse Program  [phone]   Psychological Services Organization         Address  Phone  Notes  [facility]  [phone]- [phone]   [facility]  [phone]- [phone]   [facility] [address], [place] [phone] or [phone]    Mobile Crisis Teams Organization         Address  Phone  Notes  [facility], Mobile  Crisis Care Unit  [phone]   [facility]  [address] [place], [place] [phone]   [facility] [address] [place] [phone]  Self-Help/Support Groups Organization         Address  Phone             Notes  [facility] [place] - variety of support groups  [place] Call for more information  Narcotics Anonymous (NA), [facility] [address], [employer] Dewy Rose  2 meetings at this location   Special educational needs teacher         Address  Phone  Notes  [facility] [place],    [place]  [phone]   [facility]  [address], [place] [address], Circle, [place]   [place]otia Kirkman, [place] [phone] Admissions: 8am-3pm M-F  Incentives Substance [place] [address],    [place], [place] [phone]   [facility] [address] [place], [place], [place]   [facility] [address]  [place], [place]   [facility] - Intensive Outpatient [address] Kristeen Mans [address], [place], [place]   [facility] ([place] [address].,  [place], [place] [phone] or [phone]   [facility] ([facility]) [address] [place], [place] Accepts Medicaid  [facility] [place] [address]  [place] [phone] Substance Abuse/Addiction Treatment   [facility] Organization         Address  Phone  Notes  [facility]  [phone]   Domenic Schwab, PhD [address] Arlis Porta [place], [place]   [phone] or [phone]   [place], [place] [phone]   [facility] [address] [place], [place] [phone] Insurance/Medicaid/sponsorship through [facility] Families [address]                                    [place], [place] [phone] [place] [address], [place] [phone]    Dr. Adele Schilder  [phone]   [facility] Dept. 1) [address], [place] 2) [place] 3)  [place] [address], [place] [phone]  [phone]   [place] [phone] or [phone] (After Hours)

## 2013-05-25 NOTE — ED Provider Notes (Signed)
CSN: [id number]     Arrival date & time 05/25/13  1404 History   None    Chief Complaint  Patient presents with  . Dehydration     (Consider location/radiation/quality/duration/timing/severity/associated sxs/prior Treatment) The history is provided by the patient and a relative. No language interpreter was used.  Kendra Owens is an 80 y/o F with PMHx of thyroid disease, high cholesterol, macular degeneration presenting to the ED with daughter regarding chills, bodyaches, cough, chest congestion, and subjective fever that came on abruptly Thursday. Patient reported that she has been coughing up mild amount of phlegm. Stated that she has been feeling feverish with sweats, but stated that she has not actually taken her temperature. Reported that she has been having decreased appetite - has not been drinking fluids as much due to feeling nauseous. Stated that she has been feeling weak and dizzy for the past couple of days due to not eating. Reported that she stayed at her friend's room in an assisted living center Sunday into Tuesday - concerned that she got the flu or pneumonia from the facility. Patient stated that she has been using Mucinex with minimal relief. Reported that she was seen and assessed by her PCP this afternoon who recommended the patient to go to the ED to get fluids and chest xray. Patient was diagnosed with thrush yesterday and has been on Nystatin swish and spit - used 4 times with improvement. Denied nasal congestion, chest pain, shortness of breath, difficulty breathing, headache, sore throat, difficulty swallowing, diarrhea, vomiting, melena, hematochezia, abdominal pain, facial pressure, sudden loss of vision, AMS, changes to behavior/personality. PCP Dr. Darene Lamer. [name]  Past Medical History  Diagnosis Date  . Thyroid disease   . High cholesterol   . Macular degeneration    Past Surgical History  Procedure Laterality Date  . Abdominal hysterectomy    . Inner ear surgery     . Breast surgery     History reviewed. No pertinent family history. History  Substance Use Topics  . Smoking status: Never Smoker   . Smokeless tobacco: Not on file  . Alcohol Use: No   OB History   Grav Para Term Preterm Abortions TAB SAB Ect Mult Living                 Review of Systems  Constitutional: Positive for fever (subjective), chills and appetite change (decreased).  HENT: Negative for congestion, rhinorrhea, sore throat and trouble swallowing.   Respiratory: Positive for cough. Negative for chest tightness and shortness of breath.   Cardiovascular: Negative for chest pain.  Gastrointestinal: Positive for nausea. Negative for vomiting, abdominal pain, diarrhea, constipation, blood in stool and anal bleeding.  Musculoskeletal: Positive for myalgias. Negative for arthralgias and back pain.  Neurological: Positive for dizziness and weakness. Negative for headaches.  All other systems reviewed and are negative.      Allergies  Review of patient's allergies indicates no known allergies.  Home Medications   Current Outpatient Rx  Name  Route  Sig  Dispense  Refill  . beta carotene w/minerals (OCUVITE) tablet   Oral   Take 1 tablet by mouth daily.         Marland Kitchen levothyroxine (SYNTHROID, LEVOTHROID) 25 MCG tablet   Oral   Take 25 mcg by mouth daily before breakfast.         . Multiple Vitamin (MULTIVITAMIN WITH MINERALS) TABS tablet   Oral   Take 1 tablet by mouth daily.         Marland Kitchen  nystatin (MYCOSTATIN) 100000 UNIT/ML suspension   Oral   Take 5 mLs by mouth 4 (four) times daily.         Marland Kitchen omeprazole (PRILOSEC) 20 MG capsule   Oral   Take 20 mg by mouth daily as needed (acid reflex).         . pravastatin (PRAVACHOL) 40 MG tablet   Oral   Take 40 mg by mouth daily after breakfast.         . ciprofloxacin (CIPRO) 500 MG tablet   Oral   Take 1 tablet (500 mg total) by mouth 2 (two) times daily.   14 tablet   0   . potassium chloride SA  (K-DUR,KLOR-CON) 20 MEQ tablet   Oral   Take 1 tablet (20 mEq total) by mouth daily.   3 tablet   0    BP 143/69  Pulse 88  Temp(Src) 98.8 F (37.1 C) (Oral)  Resp 18  SpO2 95% Physical Exam  Nursing note and vitals reviewed. Constitutional: She is oriented to person, place, and time. She appears well-developed and well-nourished. No distress.  HENT:  Head: Normocephalic and atraumatic.  Dry mucus membranes  Whitish coating on the tip of the patient's tongue - negative thrush noted to the posterior oropharynx Uvula midline, symmetrical elevation  Eyes: Conjunctivae and EOM are normal. Pupils are equal, round, and reactive to light. Right eye exhibits no discharge. Left eye exhibits no discharge.  Neck: Normal range of motion. Neck supple. No tracheal deviation present.  Negative neck stiffness Negative nuchal rigidity Negative cervical lymphadenopathy  Cardiovascular: Normal rate, regular rhythm and normal heart sounds.  Exam reveals no friction rub.   No murmur heard. Pulses:      Radial pulses are 2+ on the right side, and 2+ on the left side.       Dorsalis pedis pulses are 2+ on the right side, and 2+ on the left side.  Cap refill < 3 seconds Negative swelling or pitting edema noted to the lower extremities bilaterally  Pulmonary/Chest: Effort normal and breath sounds normal. She has no wheezes. She has no rales. She exhibits no tenderness.  Patient is able to speak in full sentences without difficulty Negative use of accessory muscles  Abdominal: Soft. Bowel sounds are normal. There is no tenderness. There is no guarding.  Musculoskeletal: Normal range of motion.  Full ROM to upper and lower extremities without difficulty noted, negative ataxia noted.  Lymphadenopathy:    She has no cervical adenopathy.  Neurological: She is alert and oriented to person, place, and time. She exhibits normal muscle tone. Coordination normal.  Skin: Skin is warm and dry. No rash noted.  She is not diaphoretic. No erythema.  Psychiatric: She has a normal mood and affect. Her behavior is normal. Thought content normal.    ED Course  Procedures (including critical care time)  4:54 PM This provider discussed case with Dr. Ledell Noss - reviewed labs and imaging. As per physician, recommended patient to be given IV fluid and PO challenge. If all fine, patient can be discharged home.   7:55 PM Patient tolerating fluids well, PO. Negative episodes of emesis.   Results for orders placed during the hospital encounter of 05/25/13  CBC WITH DIFFERENTIAL      Result Value Range   WBC 4.8  4.0 - 10.5 K/uL   RBC 4.13  3.87 - 5.11 MIL/uL   Hemoglobin 12.6  12.0 - 15.0 g/dL   HCT 36.4  36.0 -  46.0 %   MCV 88.1  78.0 - 100.0 fL   MCH 30.5  26.0 - 34.0 pg   MCHC 34.6  30.0 - 36.0 g/dL   RDW 13.6  11.5 - 15.5 %   Platelets 100 (*) 150 - 400 K/uL   Neutrophils Relative % 58  43 - 77 %   Neutro Abs 2.8  1.7 - 7.7 K/uL   Lymphocytes Relative 28  12 - 46 %   Lymphs Abs 1.3  0.7 - 4.0 K/uL   Monocytes Relative 14 (*) 3 - 12 %   Monocytes Absolute 0.7  0.1 - 1.0 K/uL   Eosinophils Relative 0  0 - 5 %   Eosinophils Absolute 0.0  0.0 - 0.7 K/uL   Basophils Relative 0  0 - 1 %   Basophils Absolute 0.0  0.0 - 0.1 K/uL  COMPREHENSIVE METABOLIC PANEL      Result Value Range   Sodium 131 (*) 137 - 147 mEq/L   Potassium 4.0  3.7 - 5.3 mEq/L   Chloride 95 (*) 96 - 112 mEq/L   CO2 21  19 - 32 mEq/L   Glucose, Bld 95  70 - 99 mg/dL   BUN 30 (*) 6 - 23 mg/dL   Creatinine, Ser 1.19 (*) 0.50 - 1.10 mg/dL   Calcium 8.9  8.4 - 10.5 mg/dL   Total Protein 7.1  6.0 - 8.3 g/dL   Albumin 3.4 (*) 3.5 - 5.2 g/dL   AST 40 (*) 0 - 37 U/L   ALT 20  0 - 35 U/L   Alkaline Phosphatase 38 (*) 39 - 117 U/L   Total Bilirubin 0.2 (*) 0.3 - 1.2 mg/dL   GFR calc non Af Amer 40 (*) >90 mL/min   GFR calc Af Amer 47 (*) >90 mL/min  URINALYSIS, ROUTINE W REFLEX MICROSCOPIC      Result Value Range   Color, Urine  YELLOW  YELLOW   APPearance CLEAR  CLEAR   Specific Gravity, Urine 1.024  1.005 - 1.030   pH 5.5  5.0 - 8.0   Glucose, UA NEGATIVE  NEGATIVE mg/dL   Hgb urine dipstick NEGATIVE  NEGATIVE   Bilirubin Urine NEGATIVE  NEGATIVE   Ketones, ur NEGATIVE  NEGATIVE mg/dL   Protein, ur 30 (*) NEGATIVE mg/dL   Urobilinogen, UA 0.2  0.0 - 1.0 mg/dL   Nitrite NEGATIVE  NEGATIVE   Leukocytes, UA MODERATE (*) NEGATIVE  TROPONIN I      Result Value Range   Troponin I <0.30  <0.30 ng/mL  URINE MICROSCOPIC-ADD ON      Result Value Range   Squamous Epithelial / LPF FEW (*) RARE   WBC, UA 11-20  <3 WBC/hpf   Bacteria, UA FEW (*) RARE   Urine-Other MUCOUS PRESENT    CBC      Result Value Range   WBC 3.4 (*) 4.0 - 10.5 K/uL   RBC 3.88  3.87 - 5.11 MIL/uL   Hemoglobin 11.5 (*) 12.0 - 15.0 g/dL   HCT 34.2 (*) 36.0 - 46.0 %   MCV 88.1  78.0 - 100.0 fL   MCH 29.6  26.0 - 34.0 pg   MCHC 33.6  30.0 - 36.0 g/dL   RDW 13.8  11.5 - 15.5 %   Platelets 87 (*) 150 - 400 K/uL  BASIC METABOLIC PANEL      Result Value Range   Sodium 136 (*) 137 - 147 mEq/L   Potassium 3.1 (*) 3.7 -  5.3 mEq/L   Chloride 101  96 - 112 mEq/L   CO2 22  19 - 32 mEq/L   Glucose, Bld 109 (*) 70 - 99 mg/dL   BUN 28 (*) 6 - 23 mg/dL   Creatinine, Ser 1.07  0.50 - 1.10 mg/dL   Calcium 8.0 (*) 8.4 - 10.5 mg/dL   GFR calc non Af Amer 46 (*) >90 mL/min   GFR calc Af Amer 53 (*) >90 mL/min  TROPONIN I      Result Value Range   Troponin I <0.30  <0.30 ng/mL  CG4 I-STAT (LACTIC ACID)      Result Value Range   Lactic Acid, Venous 1.11  0.5 - 2.2 mmol/L   Dg Chest 2 View  05/25/2013   CLINICAL DATA:  Cough  EXAM: CHEST  2 VIEW  COMPARISON:  07/01/2014  FINDINGS: Calcified granuloma is again noted in the right mid lung. Mild interstitial changes are noted throughout both lungs without focal infiltrate. The cardiac shadow is stable. A hiatal hernia is noted. Degenerative changes of the thoracic spine are seen.  IMPRESSION: No acute  abnormality noted.   Electronically Signed   By: Inez Catalina M.D.   On: 05/25/2013 16:17   Labs Review Labs Reviewed  CBC WITH DIFFERENTIAL - Abnormal; Notable for the following:    Platelets 100 (*)    Monocytes Relative 14 (*)    All other components within normal limits  COMPREHENSIVE METABOLIC PANEL - Abnormal; Notable for the following:    Sodium 131 (*)    Chloride 95 (*)    BUN 30 (*)    Creatinine, Ser 1.19 (*)    Albumin 3.4 (*)    AST 40 (*)    Alkaline Phosphatase 38 (*)    Total Bilirubin 0.2 (*)    GFR calc non Af Amer 40 (*)    GFR calc Af Amer 47 (*)    All other components within normal limits  URINALYSIS, ROUTINE W REFLEX MICROSCOPIC - Abnormal; Notable for the following:    Protein, ur 30 (*)    Leukocytes, UA MODERATE (*)    All other components within normal limits  URINE MICROSCOPIC-ADD ON - Abnormal; Notable for the following:    Squamous Epithelial / LPF FEW (*)    Bacteria, UA FEW (*)    All other components within normal limits  CBC - Abnormal; Notable for the following:    WBC 3.4 (*)    Hemoglobin 11.5 (*)    HCT 34.2 (*)    Platelets 87 (*)    All other components within normal limits  BASIC METABOLIC PANEL - Abnormal; Notable for the following:    Sodium 136 (*)    Potassium 3.1 (*)    Glucose, Bld 109 (*)    BUN 28 (*)    Calcium 8.0 (*)    GFR calc non Af Amer 46 (*)    GFR calc Af Amer 53 (*)    All other components within normal limits  URINE CULTURE  TROPONIN I  TROPONIN I  CG4 I-STAT (LACTIC ACID)   Imaging Review Dg Chest 2 View  05/25/2013   CLINICAL DATA:  Cough  EXAM: CHEST  2 VIEW  COMPARISON:  07/01/2014  FINDINGS: Calcified granuloma is again noted in the right mid lung. Mild interstitial changes are noted throughout both lungs without focal infiltrate. The cardiac shadow is stable. A hiatal hernia is noted. Degenerative changes of the thoracic spine are seen.  IMPRESSION:  No acute abnormality noted.   Electronically Signed    By: Inez Catalina M.D.   On: 05/25/2013 16:17    EKG Interpretation   None       MDM   Final diagnoses:  UTI (urinary tract infection)  Dehydration  Viral syndrome   Medications  sodium chloride 0.9 % bolus 1,000 mL (0 mLs Intravenous Stopped 05/25/13 1724)  cefTRIAXone (ROCEPHIN) 1 g in dextrose 5 % 50 mL IVPB (0 g Intravenous Stopped 05/25/13 1829)  potassium chloride SA (K-DUR,KLOR-CON) CR tablet 40 mEq (40 mEq Oral Given 05/25/13 2014)   Filed Vitals:   05/25/13 1430 05/25/13 2022  BP: 122/63 143/69  Pulse: 92 88  Temp: 98.8 F (37.1 C)   TempSrc: Oral   Resp: 16 18  SpO2: 100% 95%    Patient presenting to the ED with cough, feverish feelings, bodyaches, chest congestion, decreased appetite starting Thursday afternoon. Patient was seen and assessed by her PCP this afternoon who recommended that patient to be seen and assessed in the ED for chest xray to be performed and IV fluids to be administered.  Alert and oriented. GCS 15. Heart rate and rhythm normal. Lungs clear to auscultation to upper and lower lobes. Radial and DP pulses 2+ bilaterally. Full ROM to upper and lower extremities without difficulty or ataxia noted. Negative neck stiffness, negative nuchal rigidity, negative cervical LAD. Negative meningeal signs. Benign abdominal exam - non-surgical abdomen noted. Strength intact to upper and lower extremities - equal grip strength. Negative swelling or pitting edema note to the lower extremities bilaterally.  EKG noted normal sinus rhythm with a heart rate 69 beats per minute with right bundle branch block noted  - negative changes to EKG, negative ischemic findings noted. First troponin negative elevation. Second troponin negative elevation. CBC negative elevated WBC noted - negative leukocytosis. CMP noted mild hyponatremia 131, mild decrease chloride of 95-anion gap of 15.0 mEq per liter. Elevated BUN and creatinine-BUN of 30, creatinine 1.19-suspicion to be due to  dehydration. Lactic acid negative elevation. Urinalysis noted moderate leukocytes with white blood cells 11-20 - positive pyuria noted. Urine culture pending. Chest x-ray negative for acute cardiac pulmonary disease-negative signs of pneumonia. After 2 L of IV fluids CBC and BMP repeated. Sodium has improved with increasing from 131 to 136. BUN has decreased from 30 to 28. Cr has decreased from 1.19 to 1.07. Hgb and Hct have decreased - Hgb decreased from 12.6 to 11.5 and Hct from 36.4 to 34.2 - secondary to fluids and hydration.  Doubt pneumonia. Doubt pneumothorax. Suspicion to be most likely dehydration. Patient presenting with urinary tract infection - cannot rule out possible pyelonephritis, patient has no signs of sepsis. IV fluids and medications administered in ED setting. Patient able to tolerate fluids PO without difficulty. Discussed case with attending physician who agreed for patient to be discharged. Patient stable, afebrile. Discharged patient. Discharged patient with antibiotics. Discussed with patient to rest and stay hydrated. Discussed with patient to continue to drink as much fluid as possible. Referred patient to primary care provider to be reassessed within 24 hours. Discussed with patient to closely monitor symptoms and if symptoms are to worsen or change to report back to the ED - strict return instructions given.  Patient agreed to plan of care, understood, all questions answered.    Jamse Mead, PA-C 05/26/13 [phone]

## 2013-05-26 LAB — URINE CULTURE
Colony Count: NO GROWTH
Culture: NO GROWTH

## 2013-05-26 NOTE — ED Provider Notes (Signed)
Medical screening examination/treatment/procedure(s) were conducted as a shared visit with non-physician practitioner(s) and myself.  I personally evaluated the patient during the encounter.  EKG Interpretation    Date/Time:  Monday May 25 2013 16:05:36 EST Ventricular Rate:  69 PR Interval:  143 QRS Duration: 137 QT Interval:  450 QTC Calculation: 482 R Axis:   -34 Text Interpretation:  Sinus rhythm Right bundle branch block Baseline wander in lead(s) I V1 Since previous tracing RBBB is new Confirmed by Canary Brim  MD, MARTHA [phone]) on 05/26/2013 12:32:01 AM           Pt seen and evaluated.  She had mild renal insufficiency and was somewhat dehydrated on exam.  Pt feels improved after fluids.  She is awake, alert, NAD, lungs CTA, CV RRR.  Discharged with strict return precautions.  Pt agreeable with plan.  Threasa Beards, MD 05/26/13 [phone]

## 2018-04-15 ENCOUNTER — Emergency Department (HOSPITAL_BASED_OUTPATIENT_CLINIC_OR_DEPARTMENT_OTHER)
Admission: EM | Admit: 2018-04-15 | Discharge: 2018-04-16 | Disposition: A | Discharge status: Home / Self Care | Payer: Medicare Other | Attending: Emergency Medicine | Admitting: Emergency Medicine

## 2018-04-15 ENCOUNTER — Other Ambulatory Visit: Payer: Self-pay

## 2018-04-15 ENCOUNTER — Encounter (HOSPITAL_BASED_OUTPATIENT_CLINIC_OR_DEPARTMENT_OTHER): Payer: Self-pay | Admitting: *Deleted

## 2018-04-15 DIAGNOSIS — S81801A Unspecified open wound, right lower leg, initial encounter: Secondary | ICD-10-CM | POA: Insufficient documentation

## 2018-04-15 DIAGNOSIS — Y999 Unspecified external cause status: Secondary | ICD-10-CM | POA: Insufficient documentation

## 2018-04-15 DIAGNOSIS — S81811A Laceration without foreign body, right lower leg, initial encounter: Secondary | ICD-10-CM

## 2018-04-15 DIAGNOSIS — Z79899 Other long term (current) drug therapy: Secondary | ICD-10-CM | POA: Diagnosis not present

## 2018-04-15 DIAGNOSIS — Y929 Unspecified place or not applicable: Secondary | ICD-10-CM | POA: Diagnosis not present

## 2018-04-15 DIAGNOSIS — W11XXXA Fall on and from ladder, initial encounter: Secondary | ICD-10-CM | POA: Insufficient documentation

## 2018-04-15 DIAGNOSIS — Y9389 Activity, other specified: Secondary | ICD-10-CM | POA: Diagnosis not present

## 2018-04-15 MED ORDER — LIDOCAINE-EPINEPHRINE-TETRACAINE (LET) SOLUTION: 3.0000 mL | Freq: Once | NASAL | Status: DC

## 2018-04-15 MED ORDER — LIDOCAINE-EPINEPHRINE-TETRACAINE (LET) SOLUTION
6.0000 mL | Freq: Once | NASAL | Status: AC
  Administered 2018-04-15: 6 mL via TOPICAL

## 2018-04-15 MED ORDER — LIDOCAINE-EPINEPHRINE-TETRACAINE (LET) SOLUTION
NASAL | Status: AC
  Filled 2018-04-15: qty 6

## 2018-04-15 NOTE — ED Triage Notes (Signed)
Pt c/o right leg skin tear x 6 hrs ago after fall

## 2018-04-16 NOTE — ED Notes (Signed)
Wounds cleaned with betadine and sterile water, steristrips applied.

## 2018-04-16 NOTE — ED Provider Notes (Signed)
[facility] Provider Note: [name], MD, FACEP  CSN: [id number] MRN: [id number] ARRIVAL: 04/15/18 at 2303 ROOM: [place]  [facility]   HISTORY OF PRESENT ILLNESS  04/16/18 12:25 AM Kendra Owens is a 85 y.o. female who was climbing a stepladder to put away Christmas decorations yesterday evening and fell.  She has a large skin tear to her right shin.  It is moderately painful.  The wound was bandaged by nursing staff prior to my evaluation.  She denies other injury.  She is allergic to tetanus immunizations.   Past Medical History:  Diagnosis Date  . High cholesterol   . Macular degeneration   . Thyroid disease     Past Surgical History:  Procedure Laterality Date  . ABDOMINAL HYSTERECTOMY    . BREAST SURGERY    . INNER EAR SURGERY      No family history on file.  Social History   Tobacco Use  . Smoking status: Never Smoker  Substance Use Topics  . Alcohol use: No  . Drug use: No    Prior to Admission medications   Medication Sig Start Date End Date Taking? Authorizing Provider  beta carotene w/minerals (OCUVITE) tablet Take 1 tablet by mouth daily.    [provider]  levothyroxine (SYNTHROID, LEVOTHROID) 25 MCG tablet Take 25 mcg by mouth daily before breakfast.    [provider]  Multiple Vitamin (MULTIVITAMIN WITH MINERALS) TABS tablet Take 1 tablet by mouth daily.    [provider]  nystatin (MYCOSTATIN) 100000 UNIT/ML suspension Take 5 mLs by mouth 4 (four) times daily.    [provider]  omeprazole (PRILOSEC) 20 MG capsule Take 20 mg by mouth daily as needed (acid reflex).    [provider]  potassium chloride SA (K-DUR,KLOR-CON) 20 MEQ tablet Take 1 tablet (20 mEq total) by mouth daily. 05/25/13   Sciacca, Marissa, PA-C  pravastatin (PRAVACHOL) 40 MG tablet Take 40 mg by mouth daily after breakfast.    [provider]    Allergies Patient has no known  allergies.   REVIEW OF SYSTEMS  Negative except as noted here or in the History of Present Illness.   PHYSICAL EXAMINATION  Initial Vital Signs Blood pressure (!) 165/75, pulse 83, temperature 97.8 F (36.6 C), temperature source Oral, resp. rate 18, height 5' (1.524 m), weight 49 kg, SpO2 100 %.  Examination General: Well-developed, well-nourished female in no acute distress; appearance consistent with age of record HENT: normocephalic; atraumatic Eyes: pupils equal, round and reactive to light; extraocular muscles intact; bilateral pseudophakia Neck: supple; nontender Heart: regular rate and rhythm Lungs: clear to auscultation bilaterally Abdomen: soft; nondistended; nontender; bowel sounds present Extremities: No deformity; pulses normal Neurologic: Awake, alert and oriented; motor function intact in all extremities and symmetric; no facial droop Skin: Warm and dry; skin tears right lower leg with Steri-Strips in place:    Psychiatric: Normal mood and affect   RESULTS  Summary of this visit's results, reviewed by myself:   EKG Interpretation  Date/Time:    Ventricular Rate:    PR Interval:    QRS Duration:   QT Interval:    QTC Calculation:   R Axis:     Text Interpretation:        Laboratory Studies: No results found for this or any previous visit (from the past 24 hour(s)). Imaging Studies: No results found.  ED COURSE and MDM  Nursing notes and initial vitals signs, including  pulse oximetry, reviewed.  Vitals:   04/15/18 2310 04/15/18 2311  BP: (!) 165/75   Pulse: 83   Resp: 18   Temp: 97.8 F (36.6 C)   TempSrc: Oral   SpO2: 100%   Weight:  49 kg  Height:  5' (1.524 m)    PROCEDURES    ED DIAGNOSES     ICD-10-CM   1. Fall from ladder, initial encounter W11.XXXA   2. Skin tear of right lower leg without complication, initial encounter P79.217W        Shanon Rosser, MD 04/16/18 [phone]

## 2018-04-16 NOTE — ED Notes (Signed)
Provided dressing supplies for patient and family member, verbalized understanding of poc. Pt and family left at this time.

## 2018-05-09 ENCOUNTER — Ambulatory Visit: Payer: Medicare Other | Admitting: Physician Assistant

## 2018-05-12 ENCOUNTER — Encounter (HOSPITAL_BASED_OUTPATIENT_CLINIC_OR_DEPARTMENT_OTHER): Payer: Medicare Other | Attending: Internal Medicine

## 2018-05-12 DIAGNOSIS — I87311 Chronic venous hypertension (idiopathic) with ulcer of right lower extremity: Secondary | ICD-10-CM | POA: Diagnosis not present

## 2018-05-12 DIAGNOSIS — L97811 Non-pressure chronic ulcer of other part of right lower leg limited to breakdown of skin: Secondary | ICD-10-CM | POA: Insufficient documentation

## 2018-05-12 DIAGNOSIS — H9190 Unspecified hearing loss, unspecified ear: Secondary | ICD-10-CM | POA: Diagnosis not present

## 2018-05-19 ENCOUNTER — Encounter (HOSPITAL_BASED_OUTPATIENT_CLINIC_OR_DEPARTMENT_OTHER): Payer: Medicare Other | Attending: Internal Medicine

## 2018-05-19 DIAGNOSIS — I87301 Chronic venous hypertension (idiopathic) without complications of right lower extremity: Secondary | ICD-10-CM | POA: Insufficient documentation

## 2018-05-19 DIAGNOSIS — L97212 Non-pressure chronic ulcer of right calf with fat layer exposed: Secondary | ICD-10-CM | POA: Diagnosis not present

## 2018-05-19 DIAGNOSIS — L97812 Non-pressure chronic ulcer of other part of right lower leg with fat layer exposed: Secondary | ICD-10-CM | POA: Insufficient documentation

## 2018-05-26 DIAGNOSIS — L97212 Non-pressure chronic ulcer of right calf with fat layer exposed: Secondary | ICD-10-CM | POA: Diagnosis not present

## 2018-06-02 DIAGNOSIS — L97212 Non-pressure chronic ulcer of right calf with fat layer exposed: Secondary | ICD-10-CM | POA: Diagnosis not present

## 2019-05-12 ENCOUNTER — Ambulatory Visit: Payer: Self-pay

## 2019-05-29 ENCOUNTER — Ambulatory Visit: Payer: Self-pay

## 2019-09-28 ENCOUNTER — Other Ambulatory Visit: Payer: Self-pay

## 2019-09-28 ENCOUNTER — Emergency Department (HOSPITAL_BASED_OUTPATIENT_CLINIC_OR_DEPARTMENT_OTHER)
Admission: EM | Admit: 2019-09-28 | Discharge: 2019-09-28 | Disposition: A | Discharge status: Home / Self Care | Payer: Medicare Other | Attending: Emergency Medicine | Admitting: Emergency Medicine

## 2019-09-28 ENCOUNTER — Encounter (HOSPITAL_BASED_OUTPATIENT_CLINIC_OR_DEPARTMENT_OTHER): Payer: Self-pay | Admitting: Emergency Medicine

## 2019-09-28 DIAGNOSIS — R5383 Other fatigue: Secondary | ICD-10-CM

## 2019-09-28 DIAGNOSIS — Z7982 Long term (current) use of aspirin: Secondary | ICD-10-CM | POA: Insufficient documentation

## 2019-09-28 DIAGNOSIS — N3001 Acute cystitis with hematuria: Secondary | ICD-10-CM | POA: Insufficient documentation

## 2019-09-28 LAB — URINALYSIS, MICROSCOPIC (REFLEX)

## 2019-09-28 LAB — CBC WITH DIFFERENTIAL/PLATELET
Abs Immature Granulocytes: 0.03 10*3/uL (ref 0.00–0.07)
Basophils Absolute: 0 10*3/uL (ref 0.0–0.1)
Basophils Relative: 0 %
Eosinophils Absolute: 0.3 10*3/uL (ref 0.0–0.5)
Eosinophils Relative: 3 %
HCT: 35.8 % — ABNORMAL LOW (ref 36.0–46.0)
Hemoglobin: 11.5 g/dL — ABNORMAL LOW (ref 12.0–15.0)
Immature Granulocytes: 0 %
Lymphocytes Relative: 25 %
Lymphs Abs: 2.4 10*3/uL (ref 0.7–4.0)
MCH: 29.7 pg (ref 26.0–34.0)
MCHC: 32.1 g/dL (ref 30.0–36.0)
MCV: 92.5 fL (ref 80.0–100.0)
Monocytes Absolute: 1.2 10*3/uL — ABNORMAL HIGH (ref 0.1–1.0)
Monocytes Relative: 13 %
Neutro Abs: 5.8 10*3/uL (ref 1.7–7.7)
Neutrophils Relative %: 59 %
Platelets: 130 10*3/uL — ABNORMAL LOW (ref 150–400)
RBC: 3.87 MIL/uL (ref 3.87–5.11)
RDW: 13.7 % (ref 11.5–15.5)
WBC: 9.7 10*3/uL (ref 4.0–10.5)
nRBC: 0 % (ref 0.0–0.2)

## 2019-09-28 LAB — BASIC METABOLIC PANEL
Anion gap: 6 (ref 5–15)
BUN: 22 mg/dL (ref 8–23)
CO2: 29 mmol/L (ref 22–32)
Calcium: 10 mg/dL (ref 8.9–10.3)
Chloride: 99 mmol/L (ref 98–111)
Creatinine, Ser: 1.23 mg/dL — ABNORMAL HIGH (ref 0.44–1.00)
GFR calc Af Amer: 44 mL/min — ABNORMAL LOW (ref >60)
GFR calc non Af Amer: 38 mL/min — ABNORMAL LOW (ref >60)
Glucose, Bld: 96 mg/dL (ref 70–99)
Potassium: 3.8 mmol/L (ref 3.5–5.1)
Sodium: 134 mmol/L — ABNORMAL LOW (ref 135–145)

## 2019-09-28 LAB — URINALYSIS, ROUTINE W REFLEX MICROSCOPIC
Bilirubin Urine: NEGATIVE
Glucose, UA: NEGATIVE mg/dL
Ketones, ur: NEGATIVE mg/dL
Nitrite: NEGATIVE
Protein, ur: NEGATIVE mg/dL
Specific Gravity, Urine: 1.01 (ref 1.005–1.030)
pH: 5.5 (ref 5.0–8.0)

## 2019-09-28 MED ORDER — LACTATED RINGERS IV BOLUS
500.0000 mL | Freq: Once | INTRAVENOUS | Status: AC
  Administered 2019-09-28: 500 mL via INTRAVENOUS

## 2019-09-28 MED ORDER — SULFAMETHOXAZOLE-TRIMETHOPRIM 800-160 MG PO TABS: 1.0000 | ORAL_TABLET | Freq: Two times a day (BID) | ORAL | 0 refills | Status: AC

## 2019-09-28 MED ORDER — SULFAMETHOXAZOLE-TRIMETHOPRIM 800-160 MG PO TABS
1.0000 | ORAL_TABLET | Freq: Once | ORAL | Status: AC
  Administered 2019-09-28: 1 via ORAL
  Filled 2019-09-28: qty 1

## 2019-09-28 NOTE — ED Triage Notes (Signed)
For the past 4 days pt has been unusually fatigued.  Denies pain, n/v/d.  Pt is usually very energetic and does her own yard work, [employer] research [employer], [employer].  Has an episode of this the prior weekend and it passed.  Daughter sts she had worked outside in the 90 degree weather that time, but this time they can't find a reason.

## 2019-09-29 LAB — URINE CULTURE: Culture: <10000 — AB

## 2019-10-02 NOTE — ED Provider Notes (Signed)
[facility] EMERGENCY DEPARTMENT Provider Note   CSN: [id number] Arrival date & time: 09/28/19  0813     History Chief Complaint  Patient presents with  . Fatigue    Kendra Owens is a 86 y.o. female.  HPI   86 year old female with fatigue.  Onset 3 to 4 days ago.  Persistent since then.  Out of character for her.  Describes feeling as she has no energy.  She is normally very active despite her age.  She had similar symptoms a couple weeks ago although family felt that this may be from her over doing yard work on the heat.  She rested the following day with improvement and was back to her baseline.  Denies any fevers.  Appetite has been fair.  No vomiting or diarrhea.  No fevers.  No recent medication changes.  Past Medical History:  Diagnosis Date  . High cholesterol   . Macular degeneration   . Thyroid disease     There are no problems to display for this patient.   Past Surgical History:  Procedure Laterality Date  . ABDOMINAL HYSTERECTOMY    . BREAST SURGERY    . INNER EAR SURGERY       OB History   No obstetric history on file.     No family history on file.  Social History   Tobacco Use  . Smoking status: Never Smoker  . Smokeless tobacco: Never Used  Substance Use Topics  . Alcohol use: No  . Drug use: No    Home Medications Prior to Admission medications   Medication Sig Start Date End Date Taking? Authorizing Provider  aspirin 81 MG chewable tablet Chew by mouth.    [provider]  Ergocalciferol 50 MCG (2000 UT) CAPS Take 2 capsules by mouth daily.    [provider]  levothyroxine (SYNTHROID, LEVOTHROID) 25 MCG tablet Take 25 mcg by mouth daily before breakfast.    [provider]  Multiple Vitamin (MULTIVITAMIN WITH MINERALS) TABS tablet Take 1 tablet by mouth daily.    [provider]  pravastatin (PRAVACHOL) 40 MG tablet Take 40 mg by mouth daily after breakfast.    [provider]    sulfamethoxazole-trimethoprim (BACTRIM DS) 800-160 MG tablet Take 1 tablet by mouth 2 (two) times daily for 4 days. 09/28/19 10/02/19  Virgel Manifold, MD    Allergies    Penicillins  Review of Systems   Review of Systems All systems reviewed and negative, other than as noted in HPI.  Physical Exam Updated Vital Signs BP (!) 146/69   Pulse 66   Temp 98.2 F (36.8 C) (Oral)   Resp 17   Ht 5' (1.524 m)   Wt 47.2 kg   SpO2 94%   BMI 20.31 kg/m   Physical Exam Vitals and nursing note reviewed.  Constitutional:      General: She is not in acute distress.    Appearance: She is well-developed.  HENT:     Head: Normocephalic and atraumatic.  Eyes:     General:        Right eye: No discharge.        Left eye: No discharge.     Conjunctiva/sclera: Conjunctivae normal.  Cardiovascular:     Rate and Rhythm: Normal rate and regular rhythm.     Heart sounds: Normal heart sounds. No murmur heard.  No friction rub. No gallop.   Pulmonary:     Effort: Pulmonary effort is normal. No respiratory  distress.     Breath sounds: Normal breath sounds.  Abdominal:     General: There is no distension.     Palpations: Abdomen is soft.     Tenderness: There is no abdominal tenderness.  Musculoskeletal:        General: No tenderness.     Cervical back: Neck supple.  Skin:    General: Skin is warm and dry.  Neurological:     Mental Status: She is alert.  Psychiatric:        Behavior: Behavior normal.        Thought Content: Thought content normal.     ED Results / Procedures / Treatments   Labs (all labs ordered are listed, but only abnormal results are displayed) Labs Reviewed  URINE CULTURE - Abnormal; Notable for the following components:      Result Value   Culture   (*)    Value: <10,000 COLONIES/mL INSIGNIFICANT GROWTH Performed at [facility], [address] [place], [place] [address]    All other components within normal limits  CBC WITH DIFFERENTIAL/PLATELET -  Abnormal; Notable for the following components:   Hemoglobin 11.5 (*)    HCT 35.8 (*)    Platelets 130 (*)    Monocytes Absolute 1.2 (*)    All other components within normal limits  BASIC METABOLIC PANEL - Abnormal; Notable for the following components:   Sodium 134 (*)    Creatinine, Ser 1.23 (*)    GFR calc non Af Amer 38 (*)    GFR calc Af Amer 44 (*)    All other components within normal limits  URINALYSIS, ROUTINE W REFLEX MICROSCOPIC - Abnormal; Notable for the following components:   Hgb urine dipstick SMALL (*)    Leukocytes,Ua TRACE (*)    All other components within normal limits  URINALYSIS, MICROSCOPIC (REFLEX) - Abnormal; Notable for the following components:   Bacteria, UA FEW (*)    All other components within normal limits    EKG EKG Interpretation  Date/Time:  Monday September 28 2019 08:41:54 EDT Ventricular Rate:  70 PR Interval:    QRS Duration: 134 QT Interval:  423 QTC Calculation: 457 R Axis:   -36 Text Interpretation: Sinus rhythm Consider left atrial enlargement Right bundle branch block Confirmed by Virgel Manifold [phone]) on 09/28/2019 8:55:22 AM   Radiology No results found.  Procedures Procedures (including critical care time)  Medications Ordered in ED Medications  lactated ringers bolus 500 mL ( Intravenous Stopped 09/28/19 1032)  sulfamethoxazole-trimethoprim (BACTRIM DS) 800-160 MG per tablet 1 tablet (1 tablet Oral Given 09/28/19 1125)    ED Course  I have reviewed the triage vital signs and the nursing notes.  Pertinent labs & imaging results that were available during my care of the patient were reviewed by me and considered in my medical decision making (see chart for details).    MDM Rules/Calculators/A&P                         86 year old female with generalized fatigue.  Neuro exam nonfocal.  Afebrile.  Nontoxic.  Unclear etiology.  Questionable UTI.  Given age and symptoms, will treat although she lacks specific urinary symptoms.   Will send a urine culture.  Low suspicion for emergent process.  Outpatient follow-up.   Final Clinical Impression(s) / ED Diagnoses Final diagnoses:  Other fatigue  Acute cystitis with hematuria    Rx / DC Orders ED Discharge Orders  Ordered    sulfamethoxazole-trimethoprim (BACTRIM DS) 800-160 MG tablet  2 times daily     Discontinue  Reprint     09/28/19 1148           Virgel Manifold, MD 10/02/19 1249

## 2019-11-16 ENCOUNTER — Other Ambulatory Visit: Payer: Self-pay | Admitting: General Surgery

## 2019-11-16 DIAGNOSIS — C4361 Malignant melanoma of right upper limb, including shoulder: Secondary | ICD-10-CM

## 2019-11-16 DIAGNOSIS — C4362 Malignant melanoma of left upper limb, including shoulder: Secondary | ICD-10-CM

## 2019-11-24 ENCOUNTER — Encounter (HOSPITAL_COMMUNITY)
Admission: RE | Admit: 2019-11-24 | Discharge: 2019-11-24 | Disposition: A | Discharge status: Home / Self Care | Payer: Medicare Other | Source: Ambulatory Visit | Attending: General Surgery | Admitting: General Surgery

## 2019-11-24 ENCOUNTER — Encounter (HOSPITAL_COMMUNITY): Payer: Self-pay

## 2019-11-24 ENCOUNTER — Other Ambulatory Visit: Payer: Self-pay

## 2019-11-24 DIAGNOSIS — Z01812 Encounter for preprocedural laboratory examination: Secondary | ICD-10-CM | POA: Diagnosis not present

## 2019-11-24 HISTORY — DX: Malignant (primary) neoplasm, unspecified: C80.1

## 2019-11-24 HISTORY — DX: Hypothyroidism, unspecified: E03.9

## 2019-11-24 HISTORY — DX: Unspecified osteoarthritis, unspecified site: M19.90

## 2019-11-24 LAB — CBC WITH DIFFERENTIAL/PLATELET
Abs Immature Granulocytes: 0.03 10*3/uL (ref 0.00–0.07)
Basophils Absolute: 0.1 10*3/uL (ref 0.0–0.1)
Basophils Relative: 1 %
Eosinophils Absolute: 0.2 10*3/uL (ref 0.0–0.5)
Eosinophils Relative: 3 %
HCT: 35.1 % — ABNORMAL LOW (ref 36.0–46.0)
Hemoglobin: 10.9 g/dL — ABNORMAL LOW (ref 12.0–15.0)
Immature Granulocytes: 0 %
Lymphocytes Relative: 35 %
Lymphs Abs: 2.9 10*3/uL (ref 0.7–4.0)
MCH: 29.4 pg (ref 26.0–34.0)
MCHC: 31.1 g/dL (ref 30.0–36.0)
MCV: 94.6 fL (ref 80.0–100.0)
Monocytes Absolute: 0.8 10*3/uL (ref 0.1–1.0)
Monocytes Relative: 9 %
Neutro Abs: 4.2 10*3/uL (ref 1.7–7.7)
Neutrophils Relative %: 52 %
Platelets: 157 10*3/uL (ref 150–400)
RBC: 3.71 MIL/uL — ABNORMAL LOW (ref 3.87–5.11)
RDW: 13.8 % (ref 11.5–15.5)
WBC: 8.2 10*3/uL (ref 4.0–10.5)
nRBC: 0 % (ref 0.0–0.2)

## 2019-11-24 LAB — COMPREHENSIVE METABOLIC PANEL
ALT: 15 U/L (ref 0–44)
AST: 26 U/L (ref 15–41)
Albumin: 3.6 g/dL (ref 3.5–5.0)
Alkaline Phosphatase: 40 U/L (ref 38–126)
Anion gap: 8 (ref 5–15)
BUN: 23 mg/dL (ref 8–23)
CO2: 27 mmol/L (ref 22–32)
Calcium: 10.1 mg/dL (ref 8.9–10.3)
Chloride: 105 mmol/L (ref 98–111)
Creatinine, Ser: 1.15 mg/dL — ABNORMAL HIGH (ref 0.44–1.00)
GFR calc Af Amer: 48 mL/min — ABNORMAL LOW (ref >60)
GFR calc non Af Amer: 41 mL/min — ABNORMAL LOW (ref >60)
Glucose, Bld: 96 mg/dL (ref 70–99)
Potassium: 4.3 mmol/L (ref 3.5–5.1)
Sodium: 140 mmol/L (ref 135–145)
Total Bilirubin: 0.6 mg/dL (ref 0.3–1.2)
Total Protein: 7 g/dL (ref 6.5–8.1)

## 2019-11-24 NOTE — Progress Notes (Signed)
PCP - Nicholes Rough, PA-C Cardiologist - Denies  PPM/ICD - Denies  Chest x-ray - N/A EKG - 09/28/19 Stress Test - Denies ECHO - 02/01/04 Cardiac Cath - Denies  Sleep Study - Denies  Patient denies being diabetic.  Blood Thinner Instructions: N/A Aspirin Instructions: Per patient's daughter, stop 5 days prior to sx; last dose 11/27/19.  ERAS Protcol - Yes PRE-SURGERY Ensure or G2- Not ordered  COVID TEST- 11/28/19   Anesthesia review: Yes, review ECHO.  Patient denies shortness of breath, fever, cough and chest pain at PAT appointment   All instructions explained to the patient, with a verbal understanding of the material. Patient agrees to go over the instructions while at home for a better understanding. Patient also instructed to self quarantine after being tested for COVID-19. The opportunity to ask questions was provided.

## 2019-11-24 NOTE — Pre-Procedure Instructions (Signed)
Your procedure is scheduled on Wednesday, August 18, from 08:30 AM- 10:00 AM.  Report to [facility] Main Entrance "A" at 06:30 A.M., and check in at the Admitting office.  Call this number if you have problems the morning of surgery:  [phone]    Remember:  Do not eat after midnight the night before your surgery.  You may drink clear liquids until 05:30 AM the morning of your surgery.   Clear liquids allowed are: Water, Non-Citrus Juices (without pulp), Carbonated Beverages, Clear Tea, Black Coffee Only, and Gatorade.    Take these medicines the morning of surgery with A SIP OF WATER: levothyroxine (SYNTHROID, LEVOTHROID) Polyethyl Glycol-Propyl Glycol (SYSTANE) eye drops sodium chloride (OCEAN) nasal spray   *Follow your surgeon's instructions on when to stop Aspirin.  If no instructions were given by your surgeon then you will need to call the office to get those instructions.     As of today, STOP taking any Aleve, Naproxen, Ibuprofen, Motrin, Advil, Goody's, BC's, all herbal medications, fish oil, and all vitamins.       The Morning of Surgery:               Do not wear jewelry, make up, or nail polish.            Do not wear lotions, powders, perfumes, or deodorant.            Do not shave 48 hours prior to surgery.              Do not bring valuables to the hospital.            [facility] is not responsible for any belongings or valuables.  Do NOT Smoke (Tobacco/Vaping) or drink Alcohol 24 hours prior to your procedure.  If you use a CPAP at night, you may bring all equipment for your overnight stay.   Contacts, glasses, dentures or bridgework may not be worn into surgery.      For patients admitted to the hospital, discharge time will be determined by your treatment team.   Patients discharged the day of surgery will not be allowed to drive home, and someone needs to stay with them for 24 hours.    Special instructions:   [facility]- Preparing For  Surgery  Before surgery, you can play an important role. Because skin is not sterile, your skin needs to be as free of germs as possible. You can reduce the number of germs on your skin by washing with CHG (chlorahexidine gluconate) Soap before surgery.  CHG is an antiseptic cleaner which kills germs and bonds with the skin to continue killing germs even after washing.    Oral Hygiene is also important to reduce your risk of infection.  Remember - BRUSH YOUR TEETH THE MORNING OF SURGERY WITH YOUR REGULAR TOOTHPASTE  Please do not use if you have an allergy to CHG or antibacterial soaps. If your skin becomes reddened/irritated stop using the CHG.  Do not shave (including legs and underarms) for at least 48 hours prior to first CHG shower. It is OK to shave your face.  Please follow these instructions carefully.   1. Shower the NIGHT BEFORE SURGERY and the MORNING OF SURGERY with CHG Soap.   2. If you chose to wash your hair, wash your hair first as usual with your normal shampoo.  3. After you shampoo, rinse your hair and body thoroughly to remove the shampoo.  4. Use CHG as you  would any other liquid soap. You can apply CHG directly to the skin and wash gently with a scrungie or a clean washcloth.   5. Apply the CHG Soap to your body ONLY FROM THE NECK DOWN.  Do not use on open wounds or open sores. Avoid contact with your eyes, ears, mouth and genitals (private parts). Wash Face and genitals (private parts)  with your normal soap.   6. Wash thoroughly, paying special attention to the area where your surgery will be performed.  7. Thoroughly rinse your body with warm water from the neck down.  8. DO NOT shower/wash with your normal soap after using and rinsing off the CHG Soap.  9. Pat yourself dry with a CLEAN TOWEL.  10. Wear CLEAN PAJAMAS to bed the night before surgery  11. Place CLEAN SHEETS on your bed the night of your first shower and DO NOT SLEEP WITH PETS.   Day of  Surgery: Wear Clean/Comfortable clothing the morning of surgery. Do not apply any deodorants/lotions.   Remember to brush your teeth WITH YOUR REGULAR TOOTHPASTE.   Please read over the following fact sheets that you were given.

## 2019-11-28 ENCOUNTER — Other Ambulatory Visit (HOSPITAL_COMMUNITY)
Admission: RE | Admit: 2019-11-28 | Discharge: 2019-11-28 | Disposition: A | Discharge status: Home / Self Care | Payer: Medicare Other | Source: Ambulatory Visit | Attending: General Surgery | Admitting: General Surgery

## 2019-11-28 DIAGNOSIS — Z01812 Encounter for preprocedural laboratory examination: Secondary | ICD-10-CM | POA: Diagnosis present

## 2019-11-28 DIAGNOSIS — Z20822 Contact with and (suspected) exposure to covid-19: Secondary | ICD-10-CM | POA: Diagnosis not present

## 2019-11-28 LAB — SARS CORONAVIRUS 2 (TAT 6-24 HRS): SARS Coronavirus 2: NEGATIVE

## 2019-11-30 NOTE — H&P (Signed)
Kendra Owens Location: [facility] Surgery Patient #: [id number] DOB: 1925/08/06 Widowed / Language: Cleophus Molt / Race: White Female   History of Present Illness The patient is a 86 year old female who presents with malignant melanoma. Pt is a lovely 86 yo F who is referred by Lennie Odor, PA-C for new diagnosis of melanoma of the right forearm 10/2019. She had a spot there last year that looked like a flat "age spot" tha was frozen. This came back and got a little bit raised. It was biopsied this year and is melanoma. She also had a spot on the left shin that was biopsied and was squamous cell cancer. The left shin biopsy was "very painful." She has no other history of cancer. 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 She is very active. She went to the ED around a month ago because she got dizzy while she was painting her deck steps in the hot sun. She got a skin tear a few months back when she was pulling back limbs.   She has no family history of melanoma, but does have family history of breast cancer in her daughter and colon cancer in her mother.   Pathology is [facility] surgery center [phone] Shave left anterior lower leg invasive squamous cell cancer extending to deep margin.  Right posterior forearm, malignant melanoma, breslow 2.2 mm margins involved, both peripheral and deep. no ulceration. + regression. no LVI or perineural invasion.  No microsatellitosis.  pT3a (at least)   Past Surgical History Breast Biopsy  Left. Breast Mass; Local Excision  Left. Cataract Surgery  Bilateral. Hysterectomy (not due to cancer) - Complete  Oral Surgery   Diagnostic Studies History  Colonoscopy  5-10 years ago Mammogram  >3 years ago Pap Smear  >5 years ago  Allergies  Penicillins   Medication History  Levothyroxine Sodium (50MCG Tablet, Oral) Active. Pravastatin Sodium (40MG  Tablet, Oral) Active. Medications  Reconciled  Social History Caffeine use  Carbonated beverages, Coffee, Tea. No alcohol use  No drug use  Tobacco use  Never smoker.  Family History  Arthritis  Father, Mother. Breast Cancer  Daughter. Cerebrovascular Accident  Mother. Colon Cancer  Mother. Colon Polyps  Mother. Heart Disease  Father. Heart disease in female family member before age 66  Hypertension  Mother. Respiratory Condition  Father. Thyroid problems  Mother.  Pregnancy / Birth History Age at menarche  66 years. Age of menopause  31-60 Contraceptive History  Oral contraceptives. Gravida  3 Maternal age  73-20 Para  3  Other Problems Arthritis  Gastroesophageal Reflux Disease  Melanoma  Thyroid Disease     Review of Systems  General Not Present- Appetite Loss, Chills, Fatigue, Fever, Night Sweats, Weight Gain and Weight Loss. Skin Present- Change in Wart/Mole. Not Present- Dryness, Hives, Jaundice, New Lesions, Non-Healing Wounds, Rash and Ulcer. HEENT Present- Hearing Loss, Seasonal Allergies, Sinus Pain, Visual Disturbances and Wears glasses/contact lenses. Not Present- Earache, Hoarseness, Nose Bleed, Oral Ulcers, Ringing in the Ears, Sore Throat and Yellow Eyes. Respiratory Not Present- Bloody sputum, Chronic Cough, Difficulty Breathing, Snoring and Wheezing. Breast Not Present- Breast Mass, Breast Pain, Nipple Discharge and Skin Changes. Cardiovascular Not Present- Chest Pain, Difficulty Breathing Lying Down, Leg Cramps, Palpitations, Rapid Heart Rate, Shortness of Breath and Swelling of Extremities. Gastrointestinal Present- Constipation. Not Present- Abdominal Pain, Bloating, Bloody Stool, Change in Bowel Habits, Chronic diarrhea, Difficulty Swallowing, Excessive gas, Gets full quickly at meals, Hemorrhoids, Indigestion, Nausea,  Rectal Pain and Vomiting. Female Genitourinary Present- Urgency. Not Present- Frequency, Nocturia, Painful Urination and Pelvic  Pain. Musculoskeletal Present- Joint Pain. Not Present- Back Pain, Joint Stiffness, Muscle Pain, Muscle Weakness and Swelling of Extremities. Neurological Present- Numbness, Tingling and Trouble walking. Not Present- Decreased Memory, Fainting, Headaches, Seizures, Tremor and Weakness. Endocrine Present- Cold Intolerance. Not Present- Excessive Hunger, Hair Changes, Heat Intolerance, Hot flashes and New Diabetes. Hematology Present- Easy Bruising. Not Present- Blood Thinners, Excessive bleeding, Gland problems, HIV and Persistent Infections.  Vitals Weight: 101.13 lb Height: 60in Body Surface Area: 1.4 m Body Mass Index: 19.75 kg/m  Temp.: 97.70F (Temporal)  Pulse: 53 (Regular)  P.OX: 95% (Room air) BP: 110/80(Sitting, Left Arm, Standard)       Physical Exam  General Mental Status-Alert. General Appearance-Consistent with stated age. Hydration-Well hydrated. Voice-Normal.  Integumentary Note: Thin skin. right forearm with 1x2 cm scab. no residual pigmentation. Plenty of loose skin in this area. left shin wtih 1x1.5 cm scab. some loose skin here.   Head and Neck Head-normocephalic, atraumatic with no lesions or palpable masses. Trachea-midline. Thyroid Gland Characteristics - normal size and consistency.  Eye Eyeball - Bilateral-Extraocular movements intact. Sclera/Conjunctiva - Bilateral-No scleral icterus.  Chest and Lung Exam Chest and lung exam reveals -quiet, even and easy respiratory effort with no use of accessory muscles and on auscultation, normal breath sounds, no adventitious sounds and normal vocal resonance. Inspection Chest Wall - Normal. Back - normal.  Cardiovascular Cardiovascular examination reveals -normal heart sounds, regular rate and rhythm with no murmurs and normal pedal pulses bilaterally.  Abdomen Inspection Inspection of the abdomen reveals - No Hernias. Palpation/Percussion Palpation and Percussion of the  abdomen reveal - Soft, Non Tender, No Rebound tenderness, No Rigidity (guarding) and No hepatosplenomegaly. Auscultation Auscultation of the abdomen reveals - Bowel sounds normal.  Neurologic Neurologic evaluation reveals -alert and oriented x 3 with no impairment of recent or remote memory. Mental Status-Normal. Note: slightly hard of hearing.   Musculoskeletal Global Assessment -Note: no gross deformities.  Normal Exam - Left-Upper Extremity Strength Normal and Lower Extremity Strength Normal. Normal Exam - Right-Upper Extremity Strength Normal and Lower Extremity Strength Normal.  Lymphatic Head & Neck  General Head & Neck Lymphatics: Bilateral - Description - Normal. Axillary  General Axillary Region: Bilateral - Description - Normal. Tenderness - Non Tender. Femoral & Inguinal  Generalized Femoral & Inguinal Lymphatics: Bilateral - Description - No Generalized lymphadenopathy.    Assessment & Plan  MALIGNANT MELANOMA OF FOREARM, RIGHT (C43.61) Impression: Will plan WLE with AFC. Will try to get 2 cm margins. She has lots of loose skin, so think this would be OK. I would close this wtih nylons given the thin skin in this area. Given her good health, I will get SLN bx even though she is 89. She is quite healthy and may be eligible for immunotherapy if nodes are positive.  I reviewed risks with patient and daughter. I reviewed main risks of wound complications like breakdown, infection, bleeding, and dissatisfaction with scar. I discussed numbness and indentation. I also reviewed risk of seroma and swelling from lymph node biopsy.  I advised that i would get PET and refer to oncology if there is cancer in the nodes. Current Plans You are being scheduled for surgery- Our schedulers will call you.  You should hear from our office's scheduling department within 5 working days about the location, date, and time of surgery. We try to make accommodations for  patient's preferences in scheduling  surgery, but sometimes the OR schedule or the surgeon's schedule prevents [place] from making those accommodations.  If you have not heard from our office [phone]) in 5 working days, call the office and ask for your surgeon's nurse.  If you have other questions about your diagnosis, plan, or surgery, call the office and ask for your surgeon's nurse.  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 This wound is also at risk of complications given tension in the shin region.

## 2019-12-01 NOTE — Anesthesia Preprocedure Evaluation (Addendum)
Anesthesia Evaluation  Patient identified by MRN, date of birth, ID band Patient awake    Reviewed: Allergy & Precautions, H&P , NPO status , Patient's Chart, lab work & pertinent test results  Airway Mallampati: II  TM Distance: >3 FB Neck ROM: Full    Dental  (+) Edentulous Upper, Edentulous Lower, Dental Advisory Given   Pulmonary neg pulmonary ROS,    breath sounds clear to auscultation       Cardiovascular Exercise Tolerance: Good negative cardio ROS   Rhythm:Regular Rate:Normal     Neuro/Psych negative neurological ROS  negative psych ROS   GI/Hepatic negative GI ROS, Neg liver ROS,   Endo/Other  negative endocrine ROSHypothyroidism   Renal/GU negative Renal ROS  negative genitourinary   Musculoskeletal  (+) Arthritis , Osteoarthritis,    Abdominal   Peds  Hematology negative hematology ROS (+)   Anesthesia Other Findings   Reproductive/Obstetrics negative OB ROS                           Anesthesia Physical Anesthesia Plan  ASA: II  Anesthesia Plan: General   Post-op Pain Management:    Induction: Intravenous  PONV Risk Score and Plan: 4 or greater and Ondansetron, Dexamethasone and Treatment may vary due to age or medical condition  Airway Management Planned: Oral ETT  Additional Equipment: None  Intra-op Plan:   Post-operative Plan: Extubation in OR  Informed Consent: I have reviewed the patients History and Physical, chart, labs and discussed the procedure including the risks, benefits and alternatives for the proposed anesthesia with the patient or authorized representative who has indicated his/her understanding and acceptance.     Dental advisory given  Plan Discussed with: CRNA, Anesthesiologist and Surgeon  Anesthesia Plan Comments:        Anesthesia Quick Evaluation

## 2019-12-02 ENCOUNTER — Ambulatory Visit (HOSPITAL_COMMUNITY): Payer: Medicare Other

## 2019-12-02 ENCOUNTER — Other Ambulatory Visit: Payer: Self-pay

## 2019-12-02 ENCOUNTER — Ambulatory Visit (HOSPITAL_COMMUNITY): Payer: Medicare Other | Admitting: Physician Assistant

## 2019-12-02 ENCOUNTER — Ambulatory Visit (HOSPITAL_COMMUNITY)
Admission: RE | Admit: 2019-12-02 | Discharge: 2019-12-02 | Disposition: A | Discharge status: Home / Self Care | Payer: Medicare Other | Attending: General Surgery | Admitting: General Surgery

## 2019-12-02 ENCOUNTER — Encounter (HOSPITAL_COMMUNITY): Payer: Self-pay | Admitting: General Surgery

## 2019-12-02 ENCOUNTER — Encounter (HOSPITAL_COMMUNITY)
Admission: RE | Disposition: A | Discharge status: Home / Self Care | Payer: Self-pay | Source: Home / Self Care | Attending: General Surgery

## 2019-12-02 ENCOUNTER — Ambulatory Visit (HOSPITAL_COMMUNITY)
Admission: RE | Admit: 2019-12-02 | Discharge: 2019-12-02 | Disposition: A | Discharge status: Home / Self Care | Payer: Medicare Other | Source: Ambulatory Visit | Attending: General Surgery | Admitting: General Surgery

## 2019-12-02 DIAGNOSIS — Z79890 Hormone replacement therapy (postmenopausal): Secondary | ICD-10-CM | POA: Diagnosis not present

## 2019-12-02 DIAGNOSIS — Z880 Allergy status to penicillin: Secondary | ICD-10-CM | POA: Diagnosis not present

## 2019-12-02 DIAGNOSIS — Z803 Family history of malignant neoplasm of breast: Secondary | ICD-10-CM | POA: Insufficient documentation

## 2019-12-02 DIAGNOSIS — C4361 Malignant melanoma of right upper limb, including shoulder: Secondary | ICD-10-CM

## 2019-12-02 DIAGNOSIS — E079 Disorder of thyroid, unspecified: Secondary | ICD-10-CM | POA: Diagnosis not present

## 2019-12-02 DIAGNOSIS — Z79899 Other long term (current) drug therapy: Secondary | ICD-10-CM | POA: Diagnosis not present

## 2019-12-02 DIAGNOSIS — M1990 Unspecified osteoarthritis, unspecified site: Secondary | ICD-10-CM | POA: Diagnosis not present

## 2019-12-02 HISTORY — PX: MELANOMA EXCISION WITH SENTINEL LYMPH NODE BIOPSY: SHX5267

## 2019-12-02 SURGERY — MELANOMA EXCISION WITH SENTINEL LYMPH NODE BIOPSY
Anesthesia: General | Site: Breast

## 2019-12-02 MED ORDER — ORAL CARE MOUTH RINSE: 15.0000 mL | Freq: Once | OROMUCOSAL | Status: AC

## 2019-12-02 MED ORDER — CHLORHEXIDINE GLUCONATE CLOTH 2 % EX PADS: 6.0000 | MEDICATED_PAD | Freq: Once | CUTANEOUS | Status: DC

## 2019-12-02 MED ORDER — EPHEDRINE 5 MG/ML INJ
INTRAVENOUS | Status: AC
  Filled 2019-12-02: qty 10

## 2019-12-02 MED ORDER — TRAMADOL HCL 50 MG PO TABS: 50.0000 mg | ORAL_TABLET | Freq: Four times a day (QID) | ORAL | 0 refills | Status: DC | PRN

## 2019-12-02 MED ORDER — LIDOCAINE HCL (PF) 1 % IJ SOLN
INTRAMUSCULAR | Status: AC
  Filled 2019-12-02: qty 30

## 2019-12-02 MED ORDER — OXYCODONE HCL 5 MG PO TABS: 2.5000 mg | ORAL_TABLET | Freq: Four times a day (QID) | ORAL | 0 refills | Status: DC | PRN

## 2019-12-02 MED ORDER — METHYLENE BLUE 1 % INJ SOLN
INTRAMUSCULAR | Status: DC | PRN
  Administered 2019-12-02: 1 mL

## 2019-12-02 MED ORDER — AMISULPRIDE (ANTIEMETIC) 5 MG/2ML IV SOLN
5.0000 mg | Freq: Once | INTRAVENOUS | Status: AC
  Administered 2019-12-02: 5 mg via INTRAVENOUS

## 2019-12-02 MED ORDER — CHLORHEXIDINE GLUCONATE 0.12 % MT SOLN
OROMUCOSAL | Status: AC
  Administered 2019-12-02: 15 mL via OROMUCOSAL
  Filled 2019-12-02: qty 15

## 2019-12-02 MED ORDER — BUPIVACAINE HCL 0.25 % IJ SOLN
INTRAMUSCULAR | Status: DC | PRN
  Administered 2019-12-02: 20 mL

## 2019-12-02 MED ORDER — LIDOCAINE 2% (20 MG/ML) 5 ML SYRINGE
INTRAMUSCULAR | Status: AC
  Filled 2019-12-02: qty 5

## 2019-12-02 MED ORDER — FENTANYL CITRATE (PF) 100 MCG/2ML IJ SOLN
INTRAMUSCULAR | Status: DC | PRN
  Administered 2019-12-02: 50 ug via INTRAVENOUS

## 2019-12-02 MED ORDER — BUPIVACAINE LIPOSOME 1.3 % IJ SUSP
INTRAMUSCULAR | Status: DC | PRN
  Administered 2019-12-02: 20 mL

## 2019-12-02 MED ORDER — EPHEDRINE SULFATE 50 MG/ML IJ SOLN
INTRAMUSCULAR | Status: DC | PRN
  Administered 2019-12-02: 5 mg via INTRAVENOUS

## 2019-12-02 MED ORDER — PROPOFOL 10 MG/ML IV BOLUS
INTRAVENOUS | Status: AC
  Filled 2019-12-02: qty 20

## 2019-12-02 MED ORDER — ROCURONIUM BROMIDE 10 MG/ML (PF) SYRINGE
PREFILLED_SYRINGE | INTRAVENOUS | Status: DC | PRN
  Administered 2019-12-02: 40 mg via INTRAVENOUS

## 2019-12-02 MED ORDER — LIDOCAINE 2% (20 MG/ML) 5 ML SYRINGE
INTRAMUSCULAR | Status: DC | PRN
  Administered 2019-12-02: 60 mg via INTRAVENOUS

## 2019-12-02 MED ORDER — ROCURONIUM BROMIDE 10 MG/ML (PF) SYRINGE
PREFILLED_SYRINGE | INTRAVENOUS | Status: AC
  Filled 2019-12-02: qty 10

## 2019-12-02 MED ORDER — SUGAMMADEX SODIUM 200 MG/2ML IV SOLN
INTRAVENOUS | Status: DC | PRN
  Administered 2019-12-02: 100 mg via INTRAVENOUS

## 2019-12-02 MED ORDER — CIPROFLOXACIN IN D5W 400 MG/200ML IV SOLN
400.0000 mg | INTRAVENOUS | Status: AC
  Administered 2019-12-02: 400 mg via INTRAVENOUS
  Filled 2019-12-02: qty 200

## 2019-12-02 MED ORDER — BUPIVACAINE LIPOSOME 1.3 % IJ SUSP
20.0000 mL | Freq: Once | INTRAMUSCULAR | Status: DC
  Filled 2019-12-02: qty 20

## 2019-12-02 MED ORDER — FENTANYL CITRATE (PF) 250 MCG/5ML IJ SOLN
INTRAMUSCULAR | Status: AC
  Filled 2019-12-02: qty 5

## 2019-12-02 MED ORDER — PROPOFOL 10 MG/ML IV BOLUS
INTRAVENOUS | Status: DC | PRN
  Administered 2019-12-02: 50 mg via INTRAVENOUS

## 2019-12-02 MED ORDER — CHLORHEXIDINE GLUCONATE 0.12 % MT SOLN: 15.0000 mL | Freq: Once | OROMUCOSAL | Status: AC

## 2019-12-02 MED ORDER — METHYLENE BLUE 0.5 % INJ SOLN
INTRAVENOUS | Status: AC
  Filled 2019-12-02: qty 10

## 2019-12-02 MED ORDER — DEXAMETHASONE SODIUM PHOSPHATE 10 MG/ML IJ SOLN
INTRAMUSCULAR | Status: AC
  Filled 2019-12-02: qty 1

## 2019-12-02 MED ORDER — FENTANYL CITRATE (PF) 100 MCG/2ML IJ SOLN: 25.0000 ug | INTRAMUSCULAR | Status: DC | PRN

## 2019-12-02 MED ORDER — BUPIVACAINE HCL (PF) 0.25 % IJ SOLN
INTRAMUSCULAR | Status: AC
  Filled 2019-12-02: qty 30

## 2019-12-02 MED ORDER — DEXAMETHASONE SODIUM PHOSPHATE 10 MG/ML IJ SOLN
INTRAMUSCULAR | Status: DC | PRN
  Administered 2019-12-02: 5 mg via INTRAVENOUS

## 2019-12-02 MED ORDER — ACETAMINOPHEN 500 MG PO TABS
1000.0000 mg | ORAL_TABLET | ORAL | Status: AC
  Administered 2019-12-02: 1000 mg via ORAL
  Filled 2019-12-02: qty 2

## 2019-12-02 MED ORDER — LACTATED RINGERS IV SOLN: INTRAVENOUS | Status: DC

## 2019-12-02 MED ORDER — LIDOCAINE HCL (PF) 1 % IJ SOLN
INTRAMUSCULAR | Status: AC
  Filled 2019-12-02: qty 5

## 2019-12-02 MED ORDER — SODIUM CHLORIDE (PF) 0.9 % IJ SOLN
INTRAMUSCULAR | Status: AC
  Filled 2019-12-02: qty 10

## 2019-12-02 MED ORDER — AMISULPRIDE (ANTIEMETIC) 5 MG/2ML IV SOLN
INTRAVENOUS | Status: AC
  Filled 2019-12-02: qty 2

## 2019-12-02 MED ORDER — TECHNETIUM TC 99M SULFUR COLLOID FILTERED
0.5000 | Freq: Once | INTRAVENOUS | Status: AC | PRN
  Administered 2019-12-02: 0.5 via INTRADERMAL

## 2019-12-02 MED ORDER — ONDANSETRON HCL 4 MG/2ML IJ SOLN
INTRAMUSCULAR | Status: AC
  Filled 2019-12-02: qty 2

## 2019-12-02 SURGICAL SUPPLY — 59 items
BENZOIN TINCTURE PRP APPL 2/3 (GAUZE/BANDAGES/DRESSINGS) ×4 IMPLANT
BLADE SURG 10 STRL SS (BLADE) ×2 IMPLANT
BNDG COHESIVE 4X5 TAN STRL (GAUZE/BANDAGES/DRESSINGS) ×2 IMPLANT
BNDG COHESIVE 6X5 TAN NS LF (GAUZE/BANDAGES/DRESSINGS) ×2 IMPLANT
BNDG ELASTIC 3X5.8 VLCR STR LF (GAUZE/BANDAGES/DRESSINGS) ×2 IMPLANT
BNDG GAUZE ELAST 4 BULKY (GAUZE/BANDAGES/DRESSINGS) ×2 IMPLANT
CANISTER SUCT 3000ML PPV (MISCELLANEOUS) ×2 IMPLANT
CHLORAPREP W/TINT 26 (MISCELLANEOUS) ×2 IMPLANT
CLIP VESOCCLUDE MED 24/CT (CLIP) ×2 IMPLANT
CLIP VESOCCLUDE SM WIDE 24/CT (CLIP) ×2 IMPLANT
CNTNR URN SCR LID CUP LEK RST (MISCELLANEOUS) ×3 IMPLANT
CONT SPEC 4OZ STRL OR WHT (MISCELLANEOUS) ×6
COVER MAYO STAND STRL (DRAPES) ×2 IMPLANT
COVER PROBE W GEL 5X96 (DRAPES) ×2 IMPLANT
COVER SURGICAL LIGHT HANDLE (MISCELLANEOUS) ×2 IMPLANT
COVER WAND RF STERILE (DRAPES) IMPLANT
DECANTER SPIKE VIAL GLASS SM (MISCELLANEOUS) ×2 IMPLANT
DERMABOND ADVANCED (GAUZE/BANDAGES/DRESSINGS) ×1
DERMABOND ADVANCED .7 DNX12 (GAUZE/BANDAGES/DRESSINGS) ×1 IMPLANT
DRAPE HALF SHEET 40X57 (DRAPES) ×2 IMPLANT
DRAPE LAPAROSCOPIC ABDOMINAL (DRAPES) ×2 IMPLANT
DRSG TEGADERM 4X4.75 (GAUZE/BANDAGES/DRESSINGS) ×4 IMPLANT
ELECT REM PT RETURN 9FT ADLT (ELECTROSURGICAL) ×2
ELECTRODE REM PT RTRN 9FT ADLT (ELECTROSURGICAL) ×1 IMPLANT
GAUZE SPONGE 2X2 8PLY STRL LF (GAUZE/BANDAGES/DRESSINGS) ×1 IMPLANT
GAUZE SPONGE 4X4 12PLY STRL LF (GAUZE/BANDAGES/DRESSINGS) ×4 IMPLANT
GLOVE BIO SURGEON STRL SZ 6 (GLOVE) ×2 IMPLANT
GLOVE INDICATOR 6.5 STRL GRN (GLOVE) ×2 IMPLANT
GOWN STRL REUS W/ TWL LRG LVL3 (GOWN DISPOSABLE) ×2 IMPLANT
GOWN STRL REUS W/TWL 2XL LVL3 (GOWN DISPOSABLE) ×4 IMPLANT
GOWN STRL REUS W/TWL LRG LVL3 (GOWN DISPOSABLE) ×4
KIT BASIN OR (CUSTOM PROCEDURE TRAY) ×2 IMPLANT
KIT TURNOVER KIT B (KITS) ×2 IMPLANT
MARKER SKIN DUAL TIP RULER LAB (MISCELLANEOUS) ×2 IMPLANT
NEEDLE 18GX1X1/2 (RX/OR ONLY) (NEEDLE) ×2 IMPLANT
NEEDLE 22X1 1/2 (OR ONLY) (NEEDLE) IMPLANT
NEEDLE FILTER BLUNT 18X 1/2SAF (NEEDLE) ×1
NEEDLE FILTER BLUNT 18X1 1/2 (NEEDLE) ×1 IMPLANT
NEEDLE HYPO 25GX1X1/2 BEV (NEEDLE) ×2 IMPLANT
NS IRRIG 1000ML POUR BTL (IV SOLUTION) ×2 IMPLANT
PACK GENERAL/GYN (CUSTOM PROCEDURE TRAY) ×2 IMPLANT
PACK UNIVERSAL I (CUSTOM PROCEDURE TRAY) ×2 IMPLANT
PAD ARMBOARD 7.5X6 YLW CONV (MISCELLANEOUS) ×4 IMPLANT
PENCIL SMOKE EVACUATOR (MISCELLANEOUS) ×2 IMPLANT
RETRACTOR ONETRAX LX 90X20 (MISCELLANEOUS) ×2 IMPLANT
SPECIMEN JAR MEDIUM (MISCELLANEOUS) ×2 IMPLANT
SPONGE GAUZE 2X2 STER 10/PKG (GAUZE/BANDAGES/DRESSINGS) ×1
STOCKINETTE IMPERVIOUS 9X36 MD (GAUZE/BANDAGES/DRESSINGS) ×2 IMPLANT
STRIP CLOSURE SKIN 1/2X4 (GAUZE/BANDAGES/DRESSINGS) ×4 IMPLANT
SUT ETHILON 2 0 FS 18 (SUTURE) IMPLANT
SUT MNCRL AB 4-0 PS2 18 (SUTURE) ×4 IMPLANT
SUT SILK 2 0 PERMA HAND 18 BK (SUTURE) ×2 IMPLANT
SUT VIC AB 2-0 SH 27 (SUTURE) ×4
SUT VIC AB 2-0 SH 27XBRD (SUTURE) ×2 IMPLANT
SUT VIC AB 3-0 SH 27 (SUTURE) ×4
SUT VIC AB 3-0 SH 27X BRD (SUTURE) ×2 IMPLANT
SYR CONTROL 10ML LL (SYRINGE) ×4 IMPLANT
TOWEL GREEN STERILE (TOWEL DISPOSABLE) ×2 IMPLANT
TOWEL GREEN STERILE FF (TOWEL DISPOSABLE) ×2 IMPLANT

## 2019-12-02 NOTE — Interval H&P Note (Signed)
History and Physical Interval Note:  12/02/2019 7:59 AM  Kendra Owens  has presented today for surgery, with the diagnosis of RIGHT FOREARM MELANOMA AND LEFT LOWER LEG SQUAMOUS CELL CANCER.  The various methods of treatment have been discussed with the patient and family. After consideration of risks, benefits and other options for treatment, the patient has consented to  Procedure(s): WIDE LOCAL EXCISION WITH ADVANCEMENT FLAP CLOSURE, SENTINEL LYMPH NODE MAPPING AND BIOPSY FOR RIGHT FOREARM MELANOMA, WIDE LOCAL EXCISION WITH ADVANCEMENT FLAP CLOSURE LEFT LOWER LEG MELANOMA (N/A) as a surgical intervention.  The patient's history has been reviewed, patient examined, no change in status, stable for surgery.  I have reviewed the patient's chart and labs.  Questions were answered to the patient's satisfaction.     Stark Klein

## 2019-12-02 NOTE — Transfer of Care (Signed)
Immediate Anesthesia Transfer of Care Note  Patient: Kendra Owens  Procedure(s) Performed: WIDE LOCAL EXCISION WITH ADVANCEMENT FLAP CLOSURE, SENTINEL LYMPH NODE MAPPING AND BIOPSY FOR RIGHT FOREARM MELANOMA, WIDE LOCAL EXCISION WITH ADVANCEMENT FLAP CLOSURE LEFT LOWER LEG MELANOMA (N/A Breast)  Patient Location: PACU  Anesthesia Type:General  Level of Consciousness: drowsy and patient cooperative  Airway & Oxygen Therapy: Patient Spontanous Breathing and Patient connected to nasal cannula oxygen  Post-op Assessment: Report given to RN, Post -op Vital signs reviewed and stable and Patient moving all extremities  Post vital signs: Reviewed and stable  Last Vitals:  Vitals Value Taken Time  BP 163/86 12/02/19 1050  Temp    Pulse 57 12/02/19 1052  Resp 20 12/02/19 1052  SpO2 100 % 12/02/19 1052  Vitals shown include unvalidated device data.  Last Pain:  Vitals:   12/02/19 0727  TempSrc:   PainSc: 0-No pain         Complications: No complications documented.

## 2019-12-02 NOTE — Discharge Instructions (Signed)
[facility] Office Phone Number [phone]   POST OP INSTRUCTIONS  Always review your discharge instruction sheet given to you by the facility where your surgery was performed.  IF YOU HAVE DISABILITY OR FAMILY LEAVE FORMS, YOU MUST BRING THEM TO THE OFFICE FOR PROCESSING.  DO NOT GIVE THEM TO YOUR DOCTOR.  1. A prescription for pain medication may be given to you upon discharge.  Take your pain medication as prescribed, if needed.  If narcotic pain medicine is not needed, then you may take acetaminophen (Tylenol) or ibuprofen (Advil) as needed. 2. Take your usually prescribed medications unless otherwise directed 3. If you need a refill on your pain medication, please contact your pharmacy.  They will contact our office to request authorization.  Prescriptions will not be filled after 5pm or on week-ends. 4. You should eat very light the first 24 hours after surgery, such as soup, crackers, pudding, etc.  Resume your normal diet the day after surgery 5. It is common to experience some constipation if taking pain medication after surgery.  Increasing fluid intake and taking a stool softener will usually help or prevent this problem from occurring.  A mild laxative (Milk of Magnesia or Miralax) should be taken according to package directions if there are no bowel movements after 48 hours. 6. You may shower in 48 hours.  The surgical glue will flake off in 2-3 weeks.   7. ACTIVITIES:  No strenuous activity or heavy lifting for 1 week.   a. You may drive when you no longer are taking prescription pain medication, you can comfortably wear a seatbelt, and you can safely maneuver your car and apply brakes. b. RETURN TO WORK:  __________n/a_______________ You should see your doctor in the office for a follow-up appointment approximately three-four weeks after your surgery.    WHEN TO CALL YOUR DOCTOR: 1. Fever over 101.0 2. Nausea and/or vomiting. 3. Extreme swelling or  bruising. 4. Continued bleeding from incision. 5. Increased pain, redness, or drainage from the incision.  The clinic staff is available to answer your questions during regular business hours.  Please don't hesitate to call and ask to speak to one of the nurses for clinical concerns.  If you have a medical emergency, go to the nearest emergency room or call 911.  A surgeon from [facility] is always on call at the hospital.  For further questions, please visit [email or web address]      

## 2019-12-02 NOTE — Op Note (Signed)
PRE-OPERATIVE DIAGNOSIS: [id number] right forearm melanoma, left shin squamous cell carcinoma  POST-OPERATIVE DIAGNOSIS:  Same  PROCEDURE:  Procedure(s): Wide local excision 1-2 cm margins right forearm, advancement flap closure for defect 7.4 cm x 4 cm, right axillary sentinel lymph node mapping and biopsy, left shin wide local excision left shin squamous cell carcinoma with advancement flap closure defect 2x1 cm  SURGEON:  Surgeon(s): Stark Klein, MD  ASSIST:  Carlena Hurl, PA-C  ANESTHESIA:   local and general  DRAINS: none   LOCAL MEDICATIONS USED:  MARCAINE    and XYLOCAINE   SPECIMEN:  Source of Specimen:  Two right axillary sentinel lymph nodes, wide local excision right forearm melanoma, left shin squamous cell carcinoma  FINDINGS:  Residual pigment in right forearm prior to WLE, scab on left shin.  SLN #1 palpable.  SLN #2 hot and palpable.  COMPLICATIONS:  Nuclear medicine injection done in OR as it was not done pre operatively.    DISPOSITION OF SPECIMEN:  PATHOLOGY  COUNTS:  YES  PLAN OF CARE: Discharge to home after PACU  PATIENT DISPOSITION:  PACU - hemodynamically stable.    PROCEDURE:   Pt was identified in the holding area, taken to the OR, and placed supine on the OR table.  General anesthesia was induced.  Time out was performed according to the surgical safety checklist.  When all was correct, we continued.     The patient was placed into the supine position .  The right arm and axilla was prepped and draped in sterile fashion.  It was immediately discovered that the nuclear medicine injection was not done.  Nuclear medicine was called.    Therefore, the left shin was addressed first.  The left anterior lower leg was prepped and draped in sterile fashion. Local anesthetic was administered under the biopsy site.  An elliptical incision was made incorporating the scab from the prior biopsy site.  The specimen was taken down to the muscle.  The defect was then 2.2  x 1 cm.  The skin was elevated and flaps were created with the cautery.  Two interrupted 3-0 vicryls were placed in the deep dermal location.  The skin was then reapproximated with running 4-0 monocryl and dressed with benzoin and steristrips.    The melanoma was identified and 1-2 cm margins were marked out. One mL methylene blue was injected intradermally around the melanoma biopsy site.  Local was administered under the melanoma and the adjacent tissue.  A #10 blade was used to incise the skin around the melanoma.  The cautery was used to take the dissection down to the fascia.  The skin was marked in situ with orientation sutures.  The cautery was used to take the specimen off the fascia, and it was passed off the table.    Skin hooks were used to elevate the edges of the incision and the skin was freed up in all directions.  This was pulled together in an longitudinal  orientation. The skin was pulled together to check the tension.  The skin was closed with running baseball stitch with 3-0 nylon.  Two 3-0 nylon horizontal mattress sutures were placed as well.     The point of maximum signal intensity was identified with the neoprobe in the axilla.  A 4 cm incision was made with a #15 blade.  The subcutaneous tissues were divided with the cautery.  A Weitlaner retractor was used to assist with visualization.  The tonsil clamp was used to  bluntly dissect the axillary fat pad.  Two sentinel lymph nodes were identified as described above.  The lymphovascular channels were clipped with hemoclips.  The nodes were passed off as specimens.  Hemostasis was achieved with the cautery.  The axilla was irrigated and closed with 3-0 Vicryl deep dermal interrupted sutures and 4-0 Monocryl running subcuticular suture.  The axilla was dressed with dermabond.    The melanoma site was cleaned, dried, and dressed with Benzoin, steristrips, gauze, and tegaderm.    Needle, sponge, and instrument counts were correct.  The  patient was awakened from anesthesia and taken to the PACU in stable condition.

## 2019-12-02 NOTE — Anesthesia Postprocedure Evaluation (Signed)
Anesthesia Post Note  Patient: Kendra Owens  Procedure(s) Performed: WIDE LOCAL EXCISION WITH ADVANCEMENT FLAP CLOSURE, SENTINEL LYMPH NODE MAPPING AND BIOPSY FOR RIGHT FOREARM MELANOMA, WIDE LOCAL EXCISION WITH ADVANCEMENT FLAP CLOSURE LEFT LOWER LEG MELANOMA (N/A Breast)     Patient location during evaluation: PACU Anesthesia Type: General Level of consciousness: awake and alert Pain management: pain level controlled Vital Signs Assessment: post-procedure vital signs reviewed and stable Respiratory status: spontaneous breathing, nonlabored ventilation and respiratory function stable Cardiovascular status: blood pressure returned to baseline and stable Postop Assessment: no apparent nausea or vomiting Anesthetic complications: no   No complications documented.  Last Vitals:  Vitals:   12/02/19 1215 12/02/19 1223  BP: (!) 174/84 (!) 174/84  Pulse: (!) 48 (!) 52  Resp: 12 17  Temp:    SpO2: 99% 99%    Last Pain:  Vitals:   12/02/19 1145  TempSrc:   PainSc: 2                  [name],W. EDMOND

## 2019-12-02 NOTE — Anesthesia Procedure Notes (Signed)
Procedure Name: Intubation Date/Time: 12/02/2019 8:29 AM Performed by: Moshe Salisbury, CRNA Pre-anesthesia Checklist: Patient identified, Emergency Drugs available, Suction available and Patient being monitored Patient Re-evaluated:Patient Re-evaluated prior to induction Oxygen Delivery Method: Circle System Utilized Preoxygenation: Pre-oxygenation with 100% oxygen Induction Type: IV induction Ventilation: Mask ventilation without difficulty and Oral airway inserted - appropriate to patient size Laryngoscope Size: Mac and 3 Grade View: Grade I Tube type: Oral Tube size: 7.0 mm Number of attempts: 1 Airway Equipment and Method: Stylet and Oral airway Placement Confirmation: ETT inserted through vocal cords under direct vision,  positive ETCO2 and breath sounds checked- equal and bilateral Secured at: 19 cm Tube secured with: Tape Dental Injury: Teeth and Oropharynx as per pre-operative assessment  Comments: AOI by Pierce Crane., SRNA under direct supervision.

## 2019-12-03 ENCOUNTER — Encounter (HOSPITAL_COMMUNITY): Payer: Self-pay | Admitting: General Surgery

## 2019-12-08 ENCOUNTER — Telehealth: Payer: Self-pay | Admitting: General Surgery

## 2019-12-08 LAB — SURGICAL PATHOLOGY

## 2019-12-08 NOTE — Telephone Encounter (Signed)
Discussed pathology with pt daughter in law.  Will set up PET and refer to oncology.

## 2019-12-09 ENCOUNTER — Other Ambulatory Visit (HOSPITAL_COMMUNITY): Payer: Self-pay | Admitting: General Surgery

## 2019-12-09 ENCOUNTER — Other Ambulatory Visit: Payer: Self-pay | Admitting: General Surgery

## 2019-12-09 DIAGNOSIS — C4361 Malignant melanoma of right upper limb, including shoulder: Secondary | ICD-10-CM

## 2019-12-17 ENCOUNTER — Ambulatory Visit (HOSPITAL_COMMUNITY): Payer: Medicare Other

## 2019-12-17 ENCOUNTER — Ambulatory Visit (HOSPITAL_COMMUNITY)
Admission: RE | Admit: 2019-12-17 | Discharge: 2019-12-17 | Disposition: A | Discharge status: Home / Self Care | Payer: Medicare Other | Source: Ambulatory Visit | Attending: General Surgery | Admitting: General Surgery

## 2019-12-17 ENCOUNTER — Other Ambulatory Visit: Payer: Self-pay

## 2019-12-17 DIAGNOSIS — I517 Cardiomegaly: Secondary | ICD-10-CM | POA: Diagnosis not present

## 2019-12-17 DIAGNOSIS — K449 Diaphragmatic hernia without obstruction or gangrene: Secondary | ICD-10-CM | POA: Diagnosis not present

## 2019-12-17 DIAGNOSIS — C4361 Malignant melanoma of right upper limb, including shoulder: Secondary | ICD-10-CM | POA: Diagnosis not present

## 2019-12-17 DIAGNOSIS — I2510 Atherosclerotic heart disease of native coronary artery without angina pectoris: Secondary | ICD-10-CM | POA: Insufficient documentation

## 2019-12-17 LAB — GLUCOSE, CAPILLARY: Glucose-Capillary: 91 mg/dL (ref 70–99)

## 2019-12-17 MED ORDER — FLUDEOXYGLUCOSE F - 18 (FDG) INJECTION
4.9000 | Freq: Once | INTRAVENOUS | Status: AC | PRN
  Administered 2019-12-17: 4.9 via INTRAVENOUS

## 2019-12-22 NOTE — Progress Notes (Signed)
Histology and Location of Primary Skin Cancer:  Malignant melanoma of RIGHT forearm  [place] Kendra Owens presented with the following signs/symptoms: a spot on her right forearm that appeared last year and looked like a flat "age spot" (it was frozen). This came back and got a little bit raised. It was biopsied this year and is melanoma. She also had a spot on the left shin that was biopsied and was squamous cell cancer.   Past/Anticipated interventions by patient's surgeon/dermatologist for current problematic lesion, if any:  12/02/2019 Dr. Stark Klein -Wide local excision 1-2 cm margins right forearm, advancement flap closure for defect 7.4 cm x 4 cm -Right axillary sentinel lymph node mapping and biopsy -Left shin wide local excision left shin squamous cell carcinoma with advancement flap closure defect 2x1 cm  Biopsies revealed:  12/02/2019 FINAL MICROSCOPIC DIAGNOSIS:  A.  SKIN, LEFT SHIN, EXCISION: RESIDUAL SQUAMOUS CELL CARCINOMA,  MARGINS FREE  COMMENT: Sections show biopsy site changes and a small residual invasive nest of squamous cell carcinoma. The margins are free. Stasis changes are present in the background dermis.  B.  SKIN, RIGHT ARM, EXCISION: RESIDUAL MALIGNANT MELANOMA, 1.0 MM IN EXCISION (STAGE UNCHANGED FROM PRIOR CASE [id number] PT3A MELANOMA MEASURING 2.2 MM), BUT N1 IN EXCISION, MARGINS FREE.  *The prior case was unavailable for review. Melanoma table repeated per prior with noted upstage with lymph nodes C.  LYMPH NODE, RIGHT AXILLARY #1, SENTINEL, BIOPSY: 0/1 SLN, NEGATIVE FOR MELANOMA  COMMENT: 1 lymph node is evaluated by H and E, HMB45, Sox-10, and MelanA and is negative for metastatic melanoma.  D.  LYMPH NODE, RIGHT AXILLARY #2, SENTINEL, BIOPSY: 1/ out of 1 SENTINEL LYMPH NODE, POSITIVE FOR MELANOMA (0.6X 0.3 MM, AND SEPARATE 0.4 X 0.3 MM FOCUS; NO ECE)  COMMENT: Two separate deposits of melanoma are seen measuring above in the lymph node. One deposit is  highlighted by HMB45 and Sox-10, and the other by MelanA and Sox-10. This upstages this melanoma to a pT3a, N1 Mx Melanoma.  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 No  Pacemaker/ICD? No  Possible current pregnancy? No--hysterectomy   Is the patient on methotrexate? No  Current Complaints / other details:   PET Scan 12/17/2019 IMPRESSION: 1. There are no specific findings identified to suggest FDG avid melanoma metastasis. 2. Focal area of superficial increased uptake along the anterolateral aspect of the left lower leg which may reflect contamination artifact. FDG avid skin lesion however cannot be excluded and correlation with direct visualization is advised. 3. Chronic interstitial changes identified within both lungs compatible with chronic interstitial lung disease. 4. Cardiac enlargement and coronary artery calcifications. 5. Large hiatal hernia.

## 2019-12-23 ENCOUNTER — Encounter: Payer: Self-pay | Admitting: Radiation Oncology

## 2019-12-23 ENCOUNTER — Ambulatory Visit
Admission: RE | Admit: 2019-12-23 | Discharge: 2019-12-23 | Disposition: A | Discharge status: Home / Self Care | Payer: Medicare Other | Source: Ambulatory Visit | Attending: Radiation Oncology | Admitting: Radiation Oncology

## 2019-12-23 ENCOUNTER — Telehealth: Payer: Self-pay | Admitting: Oncology

## 2019-12-23 ENCOUNTER — Other Ambulatory Visit: Payer: Self-pay

## 2019-12-23 VITALS — BP 146/69 | HR 73 | Temp 97.8°F | Resp 20 | Ht 60.0 in | Wt 103.2 lb

## 2019-12-23 DIAGNOSIS — I700 Atherosclerosis of aorta: Secondary | ICD-10-CM | POA: Diagnosis not present

## 2019-12-23 DIAGNOSIS — I517 Cardiomegaly: Secondary | ICD-10-CM | POA: Insufficient documentation

## 2019-12-23 DIAGNOSIS — M129 Arthropathy, unspecified: Secondary | ICD-10-CM | POA: Insufficient documentation

## 2019-12-23 DIAGNOSIS — Z79899 Other long term (current) drug therapy: Secondary | ICD-10-CM | POA: Insufficient documentation

## 2019-12-23 DIAGNOSIS — K449 Diaphragmatic hernia without obstruction or gangrene: Secondary | ICD-10-CM | POA: Insufficient documentation

## 2019-12-23 DIAGNOSIS — E7800 Pure hypercholesterolemia, unspecified: Secondary | ICD-10-CM | POA: Diagnosis not present

## 2019-12-23 DIAGNOSIS — E039 Hypothyroidism, unspecified: Secondary | ICD-10-CM | POA: Diagnosis not present

## 2019-12-23 DIAGNOSIS — C4361 Malignant melanoma of right upper limb, including shoulder: Secondary | ICD-10-CM | POA: Diagnosis not present

## 2019-12-23 DIAGNOSIS — Z7982 Long term (current) use of aspirin: Secondary | ICD-10-CM | POA: Insufficient documentation

## 2019-12-23 NOTE — Telephone Encounter (Signed)
Received a new pt referral from Dr. Isidore Moos to schedule Ms. Blais to see Dr. Alen Blew on 9/29 at 2pm. Appt date and time has been given to the pt's daughter.

## 2019-12-23 NOTE — Progress Notes (Signed)
Radiation Oncology         [phone] ________________________________  Initial outpatient Consultation  Name: Kendra Owens MRN: [id number]  Date: 12/23/2019  DOB: [birth year]  EG:BTDV, Barbera Setters, PA-C  Stark Klein, MD   REFERRING PHYSICIAN: Stark Klein, MD  DIAGNOSIS:    ICD-10-CM   1. Malignant melanoma of right forearm [facility])  C43.61 Ambulatory referral to Oncology  2. Malignant melanoma of arm, right (HCC)  C43.61    pT3a, N1 Mx Melanoma  CHIEF COMPLAINT: Here to discuss management of melanoma  HISTORY OF PRESENT ILLNESS::Kendra Owens is a 86 y.o. female who presented with a spot on her right forearm that appeared last year and looked like a flat "age spot" (it was frozen). This came back and got a little bit raised. It was biopsied this year and is melanoma. She also had a spot on the left shin that was biopsied and was squamous cell cancer.  She ultimately presented to Dr. Barry Dienes for wide local excision of both lesions. On 12/02/2019 Dr. Stark Klein performed: -Wide local excision 1-2 cm margins right forearm, advancement flap closure for defect 7.4 cm x 4 cm -Right axillary sentinel lymph node mapping and biopsy -Left shin wide local excision left shin squamous cell carcinoma with advancement flap closure defect 2x1 cm  Biopsies revealed:  12/02/2019 FINAL MICROSCOPIC DIAGNOSIS:  A.  SKIN, LEFT SHIN, EXCISION: RESIDUAL SQUAMOUS CELL CARCINOMA,  MARGINS FREE  COMMENT: Sections show biopsy site changes and a small residual invasive nest of squamous cell carcinoma. The margins are free. Stasis changes are present in the background dermis.  B.  SKIN, RIGHT ARM, EXCISION: RESIDUAL MALIGNANT MELANOMA, 1.0 MM IN EXCISION (STAGE UNCHANGED FROM PRIOR CASE [phone] PT3A MELANOMA MEASURING 2.2 MM), BUT N1 IN EXCISION, MARGINS FREE.  *The prior case was unavailable for review. Melanoma table repeated per prior with noted upstage with lymph nodes C.  LYMPH NODE, RIGHT  AXILLARY #1, SENTINEL, BIOPSY: 0/1 SLN, NEGATIVE FOR MELANOMA  COMMENT: 1 lymph node is evaluated by H and E, HMB45, Sox-10, and MelanA and is negative for metastatic melanoma.  D.  LYMPH NODE, RIGHT AXILLARY #2, SENTINEL, BIOPSY: 1/ out of 1 SENTINEL LYMPH NODE, POSITIVE FOR MELANOMA (0.6X 0.3 MM, AND SEPARATE 0.4 X 0.3 MM FOCUS; NO ECE)  COMMENT: Two separate deposits of melanoma are seen measuring above in the lymph node. One deposit is highlighted by HMB45 and Sox-10, and the other by MelanA and Sox-10. This upstages this melanoma to a pT3a, N1 Mx Melanoma.  She has undergone a PET scan on December 17, 2019.  This showed no evidence of local or distant spread.  Patient was referred to me for consideration of adjuvant radiotherapy.  She does not yet have medical oncology consultation scheduled.  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 No  Pacemaker/ICD? No  Possible current pregnancy? No--hysterectomy   Is the patient on methotrexate? No     PREVIOUS RADIATION THERAPY: No  PAST MEDICAL HISTORY:  has a past medical history of Arthritis, Cancer ([place]), High cholesterol, Hypothyroidism, Macular degeneration, and Thyroid disease.    PAST SURGICAL HISTORY: Past Surgical History:  Procedure Laterality Date  . ABDOMINAL HYSTERECTOMY    . BREAST SURGERY     benign cyst removal  . EYE SURGERY Bilateral    cataract  . INNER EAR SURGERY    .  MELANOMA EXCISION WITH SENTINEL LYMPH NODE BIOPSY N/A 12/02/2019   Procedure: WIDE LOCAL EXCISION WITH ADVANCEMENT FLAP CLOSURE, SENTINEL LYMPH NODE MAPPING AND BIOPSY FOR RIGHT FOREARM MELANOMA, WIDE LOCAL EXCISION WITH ADVANCEMENT FLAP CLOSURE LEFT LOWER LEG MELANOMA;  Surgeon: Stark Klein, MD;  Location: [facility];  Service: General;  Laterality: N/A;    FAMILY HISTORY: family history is not on file.  SOCIAL HISTORY:   reports that she has never smoked. She has never used smokeless tobacco. She reports that she does not drink alcohol and does not use drugs.  ALLERGIES: Tetanus toxoids and Penicillins  MEDICATIONS:  Current Outpatient Medications  Medication Sig Dispense Refill  . aspirin 81 MG EC tablet Take 81 mg by mouth daily.     . ERGOCALCIFEROL PO Take 4,000 mg by mouth daily.     Marland Kitchen levothyroxine (SYNTHROID, LEVOTHROID) 25 MCG tablet Take 25 mcg by mouth daily before breakfast.    . Multiple Vitamin (MULTIVITAMIN WITH MINERALS) TABS tablet Take 1 tablet by mouth daily. One a day women    . Multiple Vitamins-Minerals (PRESERVISION AREDS) CAPS Take 1 capsule by mouth daily.    Vladimir Faster Glycol-Propyl Glycol (SYSTANE) 0.4-0.3 % SOLN Place 1 drop into both eyes in the morning and at bedtime.    . pravastatin (PRAVACHOL) 40 MG tablet Take 40 mg by mouth at bedtime.     . sodium chloride (OCEAN) 0.65 % SOLN nasal spray Place 1 spray into both nostrils daily.    Marland Kitchen oxyCODONE (OXY IR/ROXICODONE) 5 MG immediate release tablet Take 0.5-1 tablets (2.5-5 mg total) by mouth every 6 (six) hours as needed for severe pain. (Patient not taking: Reported on 12/23/2019) 15 tablet 0  . traMADol (ULTRAM) 50 MG tablet Take 1 tablet (50 mg total) by mouth every 6 (six) hours as needed for moderate pain or severe pain. (Patient not taking: Reported on 12/23/2019) 15 tablet 0   No current facility-administered medications for this encounter.    REVIEW OF SYSTEMS:  Notable for that above.   PHYSICAL EXAM:  height is 5' (1.524 m) and weight is 103 lb 3.2 oz (46.8 kg). Her temperature is 97.8 F (36.6 C). Her blood pressure is 146/69 (abnormal) and her pulse is 73. Her respiration is 20 and oxygen saturation is 100%.    Skin: Scars are healing over her right axilla, forearm and left shin from recent surgery.  Specifically, the site of her left shin is still raised with erythematous tissue but no oozing.  The scars over the right  forearm and right axilla are less remarkable.  Patient is hard of hearing.  ECOG = 1  0 - Asymptomatic (Fully active, able to carry on all predisease activities without restriction)  1 - Symptomatic but completely ambulatory (Restricted in physically strenuous activity but ambulatory and able to carry out work of a light or sedentary nature. For example, light housework, office work)  2 - Symptomatic, <50% in bed during the day (Ambulatory and capable of all self care but unable to carry out any work activities. Up and about more than 50% of waking hours)  3 - Symptomatic, >50% in bed, but not bedbound (Capable of only limited self-care, confined to bed or chair 50% or more of waking hours)  4 - Bedbound (Completely disabled. Cannot carry on any self-care. Totally confined to bed or chair)  5 - Death   Eustace Pen MM, Creech RH, Tormey DC, et al. [phone]). "Toxicity and response criteria of the [facility]  Group". [place] Oncol. 5 (6): 649-55   LABORATORY DATA:  Lab Results  Component Value Date   WBC 8.2 11/24/2019   HGB 10.9 (L) 11/24/2019   HCT 35.1 (L) 11/24/2019   MCV 94.6 11/24/2019   PLT 157 11/24/2019   CMP     Component Value Date/Time   NA 140 11/24/2019 1444   K 4.3 11/24/2019 1444   CL 105 11/24/2019 1444   CO2 27 11/24/2019 1444   GLUCOSE 96 11/24/2019 1444   BUN 23 11/24/2019 1444   CREATININE 1.15 (H) 11/24/2019 1444   CALCIUM 10.1 11/24/2019 1444   PROT 7.0 11/24/2019 1444   ALBUMIN 3.6 11/24/2019 1444   AST 26 11/24/2019 1444   ALT 15 11/24/2019 1444   ALKPHOS 40 11/24/2019 1444   BILITOT 0.6 11/24/2019 1444   GFRNONAA 41 (L) 11/24/2019 1444   GFRAA 48 (L) 11/24/2019 1444         RADIOGRAPHY: NM PET Image Initial (PI) Whole Body  Result Date: 12/17/2019 CLINICAL DATA:  Initial treatment strategy for melanoma of right forearm. EXAM: NUCLEAR MEDICINE PET WHOLE BODY TECHNIQUE: 4.9 mCi F-18 FDG was injected intravenously. Full-ring PET  imaging was performed from the head to foot after the radiotracer. CT data was obtained and used for attenuation correction and anatomic localization. Fasting blood glucose: 91 mg/dl COMPARISON:  None. FINDINGS: Mediastinal blood pool activity: SUV max 2.44 HEAD/NECK: No hypermetabolic activity in the scalp. No hypermetabolic cervical lymph nodes. Incidental CT findings: none CHEST: Postoperative change identified within the right axilla. 2.7 cm fluid density structure in the right axilla likely represents a postoperative seroma. No FDG avid axillary, supraclavicular, mediastinal, or hilar lymph nodes. Incidental CT findings: Signs of chronic interstitial lung disease identified with bilateral, peripheral and lower lung zone predominant subpleural reticulation with traction bronchiectasis. Possible early subpleural honeycombing also noted. Heart size is enlarged. Aortic atherosclerosis. ABDOMEN/PELVIS: No abnormal FDG uptake identified within the liver, pancreas, spleen, or adrenal glands. No FDG avid abdominopelvic lymph nodes. Incidental CT findings: Aortic atherosclerosis. The supra renal abdominal aorta measures 1.8 cm. Infrarenal abdominal aorta measures up to 2.9 cm. Large hiatal hernia. SKELETON: No focal hypermetabolic activity to suggest skeletal metastasis. Incidental CT findings: none EXTREMITIES: There is a focal area of increased radiotracer uptake along the anterolateral aspect of the left lower leg with SUV max of 4.99. The area of uptake appears superficial along the skin surface. No corresponding soft tissue mass or nodule identified on the CT images. Incidental CT findings: none IMPRESSION: 1. There are no specific findings identified to suggest FDG avid melanoma metastasis. 2. Focal area of superficial increased uptake along the anterolateral aspect of the left lower leg which may reflect contamination artifact. FDG avid skin lesion however cannot be excluded and correlation with direct  visualization is advised. 3. Chronic interstitial changes identified within both lungs compatible with chronic interstitial lung disease. 4. Cardiac enlargement and coronary artery calcifications. 5. Large hiatal hernia. Electronically Signed   By: Kerby Moors M.D.   On: 12/17/2019 09:23   NM Sentinel Node Inj-No Rpt (Melanoma)  Result Date: 12/02/2019 Sulfur colloid was injected by the nuclear medicine technologist for melanoma sentinel node.      IMPRESSION/PLAN: This is a lovely 86 year old woman with a history of squamous cell carcinoma of the left shin and melanoma of the right forearm, metastatic to 1 of 2 lymph nodes, no extracapsular extension.  All margins are negative  I do not recommend adjuvant radiotherapy for her.  I spoke w/ Dr Barry Dienes personally about her case.  She and I are in agreement that radiotherapy would not improve the patient's life expectancy, and that the risks of radiotherapy probably outweigh the benefits given that only 1 lymph node was positive, and it did not contain extracapsular extension.  The patient and her daughter understand that radiotherapy could modestly reduce her risk of local regional recurrence, but that it would have certain side effects that I do not think are warranted.  They are in agreement with deferring radiotherapy.  She does need to see medical oncology to be considered for systemic therapy.  I will make that referral.  Patient and her daughter are pleased with this plan.  They did have some questions about the incidental findings on her PET scan.  She is asymptomatic from these incidental findings.  I recommended they discuss them further with her PCP.  We discussed measures to reduce the risk of infection during the COVID-19 pandemic.  Patient has been vaccinated.  I recommended that she get her booster shot.  The patient and her daughter are agreeable to this plan and the patient's daughter knows how to schedule this.  On date of service,  in total, I spent 45 minutes on this encounter. Patient was seen in person.   __________________________________________   Eppie Gibson, MD

## 2019-12-25 ENCOUNTER — Encounter: Payer: Self-pay | Admitting: Radiation Oncology

## 2019-12-25 DIAGNOSIS — C4361 Malignant melanoma of right upper limb, including shoulder: Secondary | ICD-10-CM | POA: Insufficient documentation

## 2020-01-13 ENCOUNTER — Inpatient Hospital Stay: Payer: Medicare Other | Attending: Oncology | Admitting: Oncology

## 2020-01-13 ENCOUNTER — Other Ambulatory Visit: Payer: Self-pay

## 2020-01-13 VITALS — BP 158/80 | HR 75 | Temp 97.8°F | Resp 18 | Ht 60.0 in | Wt 104.9 lb

## 2020-01-13 DIAGNOSIS — C4361 Malignant melanoma of right upper limb, including shoulder: Secondary | ICD-10-CM

## 2020-01-13 NOTE — Progress Notes (Signed)
Reason for the request:    Melanoma  HPI: I was asked by Dr. Barry Dienes to evaluate Kendra Owens for evaluation of melanoma.  She is a 86 year old woman with history of hypothyroidism but overall in reasonable health presented with a right forearm lesion in the last year.  She underwent biopsy by dermatology and found to have melanoma at that time.  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  She underwent wide local excision of her right forearm lesion completed by Dr. Barry Dienes on December 02, 2019.  The final pathology showed 2.2 mm superficial spreading melanoma with negative margins.  No ulceration or lymphovascular invasion.  She had 1 out of 2 lymph node involvement indicating T3AN1 pathological staging. Positive lymph node showed a small deposit of 0.6 5.3 mm and a separate 0.4 x 0.3 mm.  PET CT scan obtained on September 2 of 2021 showed no evidence of metastatic disease.  Clinically, she reports no complications at this time.  She has recovered reasonably well from her surgery.  She does report some irritation on her left shin around her excision site.  She has gained weight recently after weight loss after surgery.  She denies any excessive fatigue tiredness.  She does live independently at this point.   She does not report any headaches, blurry vision, syncope or seizures. Does not report any fevers, chills or sweats.  Does not report any cough, wheezing or hemoptysis.  Does not report any chest pain, palpitation, orthopnea or leg edema.  Does not report any nausea, vomiting or abdominal pain.  Does not report any constipation or diarrhea.  Does not report any skeletal complaints.    Does not report frequency, urgency or hematuria.  Does not report any skin rashes or lesions. Does not report any heat or cold intolerance.  Does not report any lymphadenopathy or petechiae.  Does not report any anxiety or depression.  Remaining review of systems is negative.    Past Medical History:   Diagnosis Date  . Arthritis   . Cancer ([place])    skin  . High cholesterol   . Hypothyroidism   . Macular degeneration   . Thyroid disease   :  Past Surgical History:  Procedure Laterality Date  . ABDOMINAL HYSTERECTOMY    . BREAST SURGERY     benign cyst removal  . EYE SURGERY Bilateral    cataract  . INNER EAR SURGERY    . MELANOMA EXCISION WITH SENTINEL LYMPH NODE BIOPSY N/A 12/02/2019   Procedure: WIDE LOCAL EXCISION WITH ADVANCEMENT FLAP CLOSURE, SENTINEL LYMPH NODE MAPPING AND BIOPSY FOR RIGHT FOREARM MELANOMA, WIDE LOCAL EXCISION WITH ADVANCEMENT FLAP CLOSURE LEFT LOWER LEG MELANOMA;  Surgeon: Stark Klein, MD;  Location: [facility];  Service: General;  Laterality: N/A;  :   Current Outpatient Medications:  .  aspirin 81 MG EC tablet, Take 81 mg by mouth daily. , Disp: , Rfl:  .  ERGOCALCIFEROL PO, Take 4,000 mg by mouth daily. , Disp: , Rfl:  .  levothyroxine (SYNTHROID, LEVOTHROID) 25 MCG tablet, Take 25 mcg by mouth daily before breakfast., Disp: , Rfl:  .  Multiple Vitamin (MULTIVITAMIN WITH MINERALS) TABS tablet, Take 1 tablet by mouth daily. One a day women, Disp: , Rfl:  .  Multiple Vitamins-Minerals (PRESERVISION AREDS) CAPS, Take 1 capsule by mouth daily., Disp: , Rfl:  .  oxyCODONE (OXY IR/ROXICODONE) 5 MG immediate release tablet, Take 0.5-1 tablets (2.5-5 mg total) by mouth every 6 (  six) hours as needed for severe pain. (Patient not taking: Reported on 12/23/2019), Disp: 15 tablet, Rfl: 0 .  Polyethyl Glycol-Propyl Glycol (SYSTANE) 0.4-0.3 % SOLN, Place 1 drop into both eyes in the morning and at bedtime., Disp: , Rfl:  .  pravastatin (PRAVACHOL) 40 MG tablet, Take 40 mg by mouth at bedtime. , Disp: , Rfl:  .  sodium chloride (OCEAN) 0.65 % SOLN nasal spray, Place 1 spray into both nostrils daily., Disp: , Rfl:  .  traMADol (ULTRAM) 50 MG tablet, Take 1 tablet (50 mg total) by mouth every 6 (six) hours as needed for moderate pain or severe pain. (Patient not taking:  Reported on 12/23/2019), Disp: 15 tablet, Rfl: 0:  Allergies  Allergen Reactions  . Tetanus Toxoids Rash and Swelling  . Penicillins     Pt sts she is not allergic, but "we lost a little girl from a penicillin shot", so she does not take it.  :  No family history on file.:  Social History   Socioeconomic History  . Marital status: Married    Spouse name: Not on file  . Number of children: Not on file  . Years of education: Not on file  . Highest education level: Not on file  Occupational History  . Not on file  Tobacco Use  . Smoking status: Never Smoker  . Smokeless tobacco: Never Used  Vaping Use  . Vaping Use: Never used  Substance and Sexual Activity  . Alcohol use: No  . Drug use: No  . Sexual activity: Not Currently  Other Topics Concern  . Not on file  Social History Narrative  . Not on file   Social Determinants of Health   Financial Resource Strain:   . Difficulty of Paying Living Expenses: Not on file  Food Insecurity:   . Worried About Charity fundraiser in the Last Year: Not on file  . Ran Out of Food in the Last Year: Not on file  Transportation Needs:   . Lack of Transportation (Medical): Not on file  . Lack of Transportation (Non-Medical): Not on file  Physical Activity:   . Days of Exercise per Week: Not on file  . Minutes of Exercise per Session: Not on file  Stress:   . Feeling of Stress : Not on file  Social Connections:   . Frequency of Communication with Friends and Family: Not on file  . Frequency of Social Gatherings with Friends and Family: Not on file  . Attends Religious Services: Not on file  . Active Member of Clubs or Organizations: Not on file  . Attends Archivist Meetings: Not on file  . Marital Status: Not on file  Intimate Partner Violence:   . Fear of Current or Ex-Partner: Not on file  . Emotionally Abused: Not on file  . Physically Abused: Not on file  . Sexually Abused: Not on file  :  Pertinent items are  noted in HPI.  Exam:  General appearance: alert and cooperative appeared without distress. Head: atraumatic without any abnormalities. Eyes: conjunctivae/corneas clear. PERRL.  Sclera anicteric. Throat: lips, mucosa, and tongue normal; without oral thrush or ulcers. Resp: clear to auscultation bilaterally without rhonchi, wheezes or dullness to percussion. Cardio: regular rate and rhythm, S1, S2 normal, no murmur, click, rub or gallop GI: soft, non-tender; bowel sounds normal; no masses,  no organomegaly Skin: Skin color, texture, turgor normal. No rashes or lesions Lymph nodes: Cervical, supraclavicular, and axillary nodes normal. Neurologic: Grossly  normal without any motor, sensory or deep tendon reflexes. Musculoskeletal: No joint deformity or effusion.    NM PET Image Initial (PI) Whole Body  Result Date: 12/17/2019 CLINICAL DATA:  Initial treatment strategy for melanoma of right forearm. EXAM: NUCLEAR MEDICINE PET WHOLE BODY TECHNIQUE: 4.9 mCi F-18 FDG was injected intravenously. Full-ring PET imaging was performed from the head to foot after the radiotracer. CT data was obtained and used for attenuation correction and anatomic localization. Fasting blood glucose: 91 mg/dl COMPARISON:  None. FINDINGS: Mediastinal blood pool activity: SUV max 2.44 HEAD/NECK: No hypermetabolic activity in the scalp. No hypermetabolic cervical lymph nodes. Incidental CT findings: none CHEST: Postoperative change identified within the right axilla. 2.7 cm fluid density structure in the right axilla likely represents a postoperative seroma. No FDG avid axillary, supraclavicular, mediastinal, or hilar lymph nodes. Incidental CT findings: Signs of chronic interstitial lung disease identified with bilateral, peripheral and lower lung zone predominant subpleural reticulation with traction bronchiectasis. Possible early subpleural honeycombing also noted. Heart size is enlarged. Aortic atherosclerosis. ABDOMEN/PELVIS:  No abnormal FDG uptake identified within the liver, pancreas, spleen, or adrenal glands. No FDG avid abdominopelvic lymph nodes. Incidental CT findings: Aortic atherosclerosis. The supra renal abdominal aorta measures 1.8 cm. Infrarenal abdominal aorta measures up to 2.9 cm. Large hiatal hernia. SKELETON: No focal hypermetabolic activity to suggest skeletal metastasis. Incidental CT findings: none EXTREMITIES: There is a focal area of increased radiotracer uptake along the anterolateral aspect of the left lower leg with SUV max of 4.99. The area of uptake appears superficial along the skin surface. No corresponding soft tissue mass or nodule identified on the CT images. Incidental CT findings: none IMPRESSION: 1. There are no specific findings identified to suggest FDG avid melanoma metastasis. 2. Focal area of superficial increased uptake along the anterolateral aspect of the left lower leg which may reflect contamination artifact. FDG avid skin lesion however cannot be excluded and correlation with direct visualization is advised. 3. Chronic interstitial changes identified within both lungs compatible with chronic interstitial lung disease. 4. Cardiac enlargement and coronary artery calcifications. 5. Large hiatal hernia. Electronically Signed   By: Kerby Moors M.D.   On: 12/17/2019 09:23    Assessment and Plan:   86 year old woman with:  1.  Superficial spreading melanoma of the right forearm diagnosed in August 2021.  She was found to have T3AN1 disease with 2.2 mm depth of invasion microscopically positive in 1 out of 2 lymph nodes.  Positive measuring 0.6 x 0.3 mm and a separate deposit of 0.4 x 03 mm.  Her case was discussed at the melanoma tumor board which included review of her PET scan results as well as pathology review and discussion with the reviewing pathologist.  The natural course of her disease and treatment options were reviewed.  She does have stage IIIa disease with less than 1 mm  deposit of melanoma.  Although she has 2 separate deposits she still has reasonably low risk of developing metastatic disease.  Risks and benefits of using adjuvant immunotherapy in the form of nivolumab were discussed.  This will be a monthly infusion for the next 12 months associated with complications that include pruritus, infusion related complications, fatigue and immune mediated complications.  After discussion today, given her minimal deposit in the lymph node and overall logistics of all year long immunotherapy, we opted with active surveillance at this time.  Risks associated with immunotherapy although its low it does not justify the minimum to no benefit associated  with adjuvant therapy given her minimal disease.  She has a follow-up already established with dermatology and Dr. Barry Dienes and I will be happy to see her in the future as needed.  I emphasized the importance of dermatology surveillance at this time.  Systemic therapy will only be used if she develops metastatic disease in the future.  All her questions are answered to her satisfaction.  2.  Follow-up: I am happy to see her in the future as needed.  60  minutes were dedicated to this visit. The time was spent on reviewing laboratory data, imaging studies, discussing treatment options, and answering questions regarding future plan.     A copy of this consult has been forwarded to the requesting physician.

## 2020-03-03 ENCOUNTER — Ambulatory Visit: Payer: Medicare Other | Attending: Internal Medicine

## 2020-03-03 DIAGNOSIS — Z23 Encounter for immunization: Secondary | ICD-10-CM

## 2020-03-03 NOTE — Progress Notes (Signed)
   Covid-19 Vaccination Clinic  Name:  Kendra Owens    MRN: [id number] DOB: 1925-11-12  03/03/2020  [name] was observed post Covid-19 immunization for 15 minutes without incident. She was provided with Vaccine Information Sheet and instruction to access the V-Safe system.   [name] was instructed to call 911 with any severe reactions post vaccine: Marland Kitchen Difficulty breathing  . Swelling of face and throat  . A fast heartbeat  . A bad rash all over body  . Dizziness and weakness   Immunizations Administered    Name Date Dose VIS Date Route   Pfizer COVID-19 Vaccine 03/03/2020  1:29 PM 0.3 mL 02/03/2020 Intramuscular   Manufacturer: [place]   Lot: [id number]   [place]: [id number]

## 2020-12-20 ENCOUNTER — Other Ambulatory Visit: Payer: Self-pay

## 2020-12-20 ENCOUNTER — Encounter (HOSPITAL_BASED_OUTPATIENT_CLINIC_OR_DEPARTMENT_OTHER): Payer: Self-pay | Admitting: *Deleted

## 2020-12-20 ENCOUNTER — Emergency Department (HOSPITAL_BASED_OUTPATIENT_CLINIC_OR_DEPARTMENT_OTHER)
Admission: EM | Admit: 2020-12-20 | Discharge: 2020-12-20 | Disposition: A | Discharge status: Home / Self Care | Payer: Medicare Other | Source: Home / Self Care | Attending: Emergency Medicine | Admitting: Emergency Medicine

## 2020-12-20 DIAGNOSIS — Z7982 Long term (current) use of aspirin: Secondary | ICD-10-CM | POA: Insufficient documentation

## 2020-12-20 DIAGNOSIS — R197 Diarrhea, unspecified: Secondary | ICD-10-CM | POA: Insufficient documentation

## 2020-12-20 DIAGNOSIS — E039 Hypothyroidism, unspecified: Secondary | ICD-10-CM | POA: Insufficient documentation

## 2020-12-20 DIAGNOSIS — Z85828 Personal history of other malignant neoplasm of skin: Secondary | ICD-10-CM | POA: Insufficient documentation

## 2020-12-20 DIAGNOSIS — E860 Dehydration: Secondary | ICD-10-CM | POA: Diagnosis not present

## 2020-12-20 DIAGNOSIS — Z79899 Other long term (current) drug therapy: Secondary | ICD-10-CM | POA: Insufficient documentation

## 2020-12-20 DIAGNOSIS — A0472 Enterocolitis due to Clostridium difficile, not specified as recurrent: Secondary | ICD-10-CM | POA: Diagnosis not present

## 2020-12-20 LAB — COMPREHENSIVE METABOLIC PANEL
ALT: 10 U/L (ref 0–44)
AST: 20 U/L (ref 15–41)
Albumin: 3.3 g/dL — ABNORMAL LOW (ref 3.5–5.0)
Alkaline Phosphatase: 50 U/L (ref 38–126)
Anion gap: 8 (ref 5–15)
BUN: 12 mg/dL (ref 8–23)
CO2: 26 mmol/L (ref 22–32)
Calcium: 9.3 mg/dL (ref 8.9–10.3)
Chloride: 98 mmol/L (ref 98–111)
Creatinine, Ser: 0.96 mg/dL (ref 0.44–1.00)
GFR, Estimated: 55 mL/min — ABNORMAL LOW (ref >60)
Glucose, Bld: 92 mg/dL (ref 70–99)
Potassium: 3.5 mmol/L (ref 3.5–5.1)
Sodium: 132 mmol/L — ABNORMAL LOW (ref 135–145)
Total Bilirubin: 0.4 mg/dL (ref 0.3–1.2)
Total Protein: 6.2 g/dL — ABNORMAL LOW (ref 6.5–8.1)

## 2020-12-20 LAB — URINALYSIS, ROUTINE W REFLEX MICROSCOPIC
Bilirubin Urine: NEGATIVE
Glucose, UA: NEGATIVE mg/dL
Ketones, ur: NEGATIVE mg/dL
Leukocytes,Ua: NEGATIVE
Nitrite: NEGATIVE
Specific Gravity, Urine: 1.009 (ref 1.005–1.030)
pH: 5.5 (ref 5.0–8.0)

## 2020-12-20 LAB — CBC
HCT: 31.6 % — ABNORMAL LOW (ref 36.0–46.0)
Hemoglobin: 10.2 g/dL — ABNORMAL LOW (ref 12.0–15.0)
MCH: 28.3 pg (ref 26.0–34.0)
MCHC: 32.3 g/dL (ref 30.0–36.0)
MCV: 87.5 fL (ref 80.0–100.0)
Platelets: 232 10*3/uL (ref 150–400)
RBC: 3.61 MIL/uL — ABNORMAL LOW (ref 3.87–5.11)
RDW: 13.2 % (ref 11.5–15.5)
WBC: 9.2 10*3/uL (ref 4.0–10.5)
nRBC: 0 % (ref 0.0–0.2)

## 2020-12-20 LAB — LIPASE, BLOOD: Lipase: 21 U/L (ref 11–51)

## 2020-12-20 MED ORDER — SODIUM CHLORIDE 0.9 % IV SOLN: Freq: Once | INTRAVENOUS | Status: AC

## 2020-12-20 NOTE — ED Provider Notes (Signed)
[facility] EMERGENCY DEPT Provider Note   CSN: [id number] Arrival date & time: 12/20/20  1208     History Chief Complaint  Patient presents with   Weakness   Diarrhea    Kendra Owens is a 87 y.o. female.  The history is provided by the patient.  Diarrhea Quality:  Watery Severity:  Severe Onset quality:  Sudden Number of episodes:  Has waxed and waned; today 3 but has been more Duration:  11 days Timing:  Intermittent Progression:  Unchanged Relieved by:  Nothing Worsened by:  Nothing Ineffective treatments: Pepto. Associated symptoms: no chills   Risk factors: suspect food intake   Risk factors comment:  Occurred after consuming some shrimp that had been in the fridge for a few days     Past Medical History:  Diagnosis Date   Arthritis    Cancer ([place])    skin   High cholesterol    Hypothyroidism    Macular degeneration    Thyroid disease     Patient Active Problem List   Diagnosis Date Noted   Malignant melanoma of arm, right ([place]) 12/25/2019    Past Surgical History:  Procedure Laterality Date   ABDOMINAL HYSTERECTOMY     BREAST SURGERY     benign cyst removal   EYE SURGERY Bilateral    cataract   INNER EAR SURGERY     MELANOMA EXCISION WITH SENTINEL LYMPH NODE BIOPSY N/A 12/02/2019   Procedure: WIDE LOCAL EXCISION WITH ADVANCEMENT FLAP CLOSURE, SENTINEL LYMPH NODE MAPPING AND BIOPSY FOR RIGHT FOREARM MELANOMA, WIDE LOCAL EXCISION WITH ADVANCEMENT FLAP CLOSURE LEFT LOWER LEG MELANOMA;  Surgeon: Stark Klein, MD;  Location: [facility];  Service: General;  Laterality: N/A;     OB History   No obstetric history on file.     No family history on file.  Social History   Tobacco Use   Smoking status: Never   Smokeless tobacco: Never  Vaping Use   Vaping Use: Never used  Substance Use Topics   Alcohol use: No   Drug use: No    Home Medications Prior to Admission medications   Medication Sig Start Date End Date Taking?  Authorizing Provider  aspirin 81 MG EC tablet Take 81 mg by mouth daily.     [provider]  ERGOCALCIFEROL PO Take 4,000 mg by mouth daily.     [provider]  levothyroxine (SYNTHROID, LEVOTHROID) 25 MCG tablet Take 25 mcg by mouth daily before breakfast.    [provider]  Multiple Vitamin (MULTIVITAMIN WITH MINERALS) TABS tablet Take 1 tablet by mouth daily. One a day women    [provider]  Multiple Vitamins-Minerals (PRESERVISION AREDS) CAPS Take 1 capsule by mouth daily.    [provider]  oxyCODONE (OXY IR/ROXICODONE) 5 MG immediate release tablet Take 0.5-1 tablets (2.5-5 mg total) by mouth every 6 (six) hours as needed for severe pain. Patient not taking: Reported on 12/23/2019 12/02/19   Stark Klein, MD  Polyethyl Glycol-Propyl Glycol (SYSTANE) 0.4-0.3 % SOLN Place 1 drop into both eyes in the morning and at bedtime.    [provider]  pravastatin (PRAVACHOL) 40 MG tablet Take 40 mg by mouth at bedtime.     [provider]  sodium chloride (OCEAN) 0.65 % SOLN nasal spray Place 1 spray into both nostrils daily.    [provider]  traMADol (ULTRAM) 50 MG tablet Take 1 tablet (50 mg total) by mouth every 6 (six) hours as  needed for moderate pain or severe pain. Patient not taking: Reported on 12/23/2019 12/02/19   Stark Klein, MD    Allergies    Tetanus toxoids and Penicillins  Review of Systems   Review of Systems  Constitutional:  Negative for chills.  HENT:  Negative for ear pain and sore throat.   Eyes:  Negative for pain and visual disturbance.  Respiratory:  Negative for cough and shortness of breath.   Skin:  Negative for color change and rash.  All other systems reviewed and are negative.  Physical Exam Updated Vital Signs BP (!) 145/69 (BP Location: Left Arm)   Pulse 84   Temp 98.2 F (36.8 C) (Oral)   Resp 17   Ht 5' (1.524 m)   Wt 45.4 kg   SpO2 96%   BMI 19.53 kg/m   Physical  Exam Vitals and nursing note reviewed.  HENT:     Head: Normocephalic and atraumatic.  Eyes:     General: No scleral icterus. Pulmonary:     Effort: Pulmonary effort is normal. No respiratory distress.  Abdominal:     General: There is no distension.     Tenderness: There is no abdominal tenderness. There is no guarding.  Musculoskeletal:     Cervical back: Normal range of motion.  Skin:    General: Skin is warm and dry.  Neurological:     General: No focal deficit present.     Mental Status: She is alert and oriented to person, place, and time.  Psychiatric:        Mood and Affect: Mood normal.    ED Results / Procedures / Treatments   Labs (all labs ordered are listed, but only abnormal results are displayed) Labs Reviewed  COMPREHENSIVE METABOLIC PANEL - Abnormal; Notable for the following components:      Result Value   Sodium 132 (*)    Total Protein 6.2 (*)    Albumin 3.3 (*)    GFR, Estimated 55 (*)    All other components within normal limits  CBC - Abnormal; Notable for the following components:   RBC 3.61 (*)    Hemoglobin 10.2 (*)    HCT 31.6 (*)    All other components within normal limits  URINALYSIS, ROUTINE W REFLEX MICROSCOPIC - Abnormal; Notable for the following components:   Hgb urine dipstick TRACE (*)    Protein, ur TRACE (*)    All other components within normal limits  GASTROINTESTINAL PANEL BY PCR, STOOL (REPLACES STOOL CULTURE)  C DIFFICILE QUICK SCREEN W PCR REFLEX    LIPASE, BLOOD    EKG None  Radiology No results found.  Procedures Procedures   Medications Ordered in ED Medications  0.9 %  sodium chloride infusion (has no administration in time range)    ED Course  I have reviewed the triage vital signs and the nursing notes.  Pertinent labs & imaging results that were available during my care of the patient were reviewed by me and considered in my medical decision making (see chart for details).    MDM  Rules/Calculators/A&P                           Kendra Owens is a well-appearing, independently living 87 year old who has had diarrhea for a little less than 2 weeks.  Vital signs are normal.  Abdominal exam is benign.  She was started on a rate of IV fluids.  Labs revealed very  slight hyponatremia.  She is currently awaiting stool tests and urinalysis.  Upon arrival to the ED she was able to produce a stool sample, but she urinated in the same sample.  Therefore, neither stool nor urine could be collected.    UA collected. No evidence of UTI. Stool studies pending and will be followed as an outpatient. Final Clinical Impression(s) / ED Diagnoses Final diagnoses:  Diarrhea, unspecified type    Rx / DC Orders ED Discharge Orders     None        Arnaldo Natal, MD 12/20/20 1537

## 2020-12-20 NOTE — ED Notes (Signed)
Pt ambulated to bathroom to obtain stool and urine sample. No specific complaints

## 2020-12-20 NOTE — ED Triage Notes (Signed)
Pt developed diarrhea on August 27th, has followed up with PCP and had labs. Today PCP told her to come to ED for fluids. 3 times since midnight.

## 2020-12-21 LAB — GASTROINTESTINAL PANEL BY PCR, STOOL (REPLACES STOOL CULTURE)

## 2020-12-21 LAB — C DIFFICILE QUICK SCREEN W PCR REFLEX
C Diff antigen: POSITIVE — AB
C Diff interpretation: DETECTED
C Diff toxin: POSITIVE — AB

## 2020-12-22 ENCOUNTER — Encounter (HOSPITAL_BASED_OUTPATIENT_CLINIC_OR_DEPARTMENT_OTHER): Payer: Self-pay | Admitting: *Deleted

## 2020-12-22 ENCOUNTER — Other Ambulatory Visit: Payer: Self-pay

## 2020-12-22 ENCOUNTER — Emergency Department (HOSPITAL_BASED_OUTPATIENT_CLINIC_OR_DEPARTMENT_OTHER)
Admission: EM | Admit: 2020-12-22 | Discharge: 2020-12-22 | Disposition: A | Discharge status: Home / Self Care | Payer: Medicare Other | Source: Home / Self Care | Attending: Emergency Medicine | Admitting: Emergency Medicine

## 2020-12-22 DIAGNOSIS — Z85828 Personal history of other malignant neoplasm of skin: Secondary | ICD-10-CM | POA: Insufficient documentation

## 2020-12-22 DIAGNOSIS — Z7982 Long term (current) use of aspirin: Secondary | ICD-10-CM | POA: Insufficient documentation

## 2020-12-22 DIAGNOSIS — E039 Hypothyroidism, unspecified: Secondary | ICD-10-CM | POA: Insufficient documentation

## 2020-12-22 DIAGNOSIS — A0472 Enterocolitis due to Clostridium difficile, not specified as recurrent: Secondary | ICD-10-CM

## 2020-12-22 DIAGNOSIS — Z79899 Other long term (current) drug therapy: Secondary | ICD-10-CM | POA: Insufficient documentation

## 2020-12-22 MED ORDER — VANCOMYCIN HCL 125 MG PO CAPS: 125.0000 mg | ORAL_CAPSULE | Freq: Four times a day (QID) | ORAL | 0 refills | Status: AC

## 2020-12-22 MED ORDER — SODIUM CHLORIDE 0.9 % IV BOLUS
500.0000 mL | Freq: Once | INTRAVENOUS | Status: AC
  Administered 2020-12-22: 500 mL via INTRAVENOUS

## 2020-12-22 MED ORDER — VANCOMYCIN HCL 125 MG PO CAPS
125.0000 mg | ORAL_CAPSULE | Freq: Four times a day (QID) | ORAL | Status: DC
  Filled 2020-12-22: qty 1

## 2020-12-22 NOTE — ED Provider Notes (Signed)
[facility] EMERGENCY DEPT Provider Note   CSN: [id number] Arrival date & time: 12/22/20  [id number]     History Chief Complaint  Patient presents with   Diarrhea    Kendra Owens is a 87 y.o. female.  Pt is a 87 yo female presenting to ED for diarrhea. Patient was seen on 9/06, approx 2 days ago, for similar symptoms and tested positive for c.diff. Pt has not been notified but saw it in her chart. Denies any fevers, chills,  nausea, vomiting, abdominal pain, or inability to tolerate PO. Hx of antibiotic use last month x 2 for recurrent UTI. Lives at home alone but accompanied in ED with daughter. States nonbloody diarrhea persistent since 8/17.  The history is provided by the patient. No language interpreter was used.  Diarrhea Quality:  Watery Severity:  Moderate Onset quality:  Gradual Associated symptoms: no abdominal pain, no arthralgias, no chills, no fever and no vomiting       Past Medical History:  Diagnosis Date   Arthritis    Cancer ([place])    skin   High cholesterol    Hypothyroidism    Macular degeneration    Thyroid disease     Patient Active Problem List   Diagnosis Date Noted   Malignant melanoma of arm, right ([place]) 12/25/2019    Past Surgical History:  Procedure Laterality Date   ABDOMINAL HYSTERECTOMY     BREAST SURGERY     benign cyst removal   EYE SURGERY Bilateral    cataract   INNER EAR SURGERY     MELANOMA EXCISION WITH SENTINEL LYMPH NODE BIOPSY N/A 12/02/2019   Procedure: WIDE LOCAL EXCISION WITH ADVANCEMENT FLAP CLOSURE, SENTINEL LYMPH NODE MAPPING AND BIOPSY FOR RIGHT FOREARM MELANOMA, WIDE LOCAL EXCISION WITH ADVANCEMENT FLAP CLOSURE LEFT LOWER LEG MELANOMA;  Surgeon: Stark Klein, MD;  Location: [facility];  Service: General;  Laterality: N/A;     OB History   No obstetric history on file.     No family history on file.  Social History   Tobacco Use   Smoking status: Never   Smokeless tobacco: Never  Vaping Use    Vaping Use: Never used  Substance Use Topics   Alcohol use: No   Drug use: No    Home Medications Prior to Admission medications   Medication Sig Start Date End Date Taking? Authorizing Provider  vancomycin (VANCOCIN) 125 MG capsule Take 1 capsule (125 mg total) by mouth 4 (four) times daily for 10 days. 12/22/20 [id number] Yes Campbell Stall P, DO  aspirin 81 MG EC tablet Take 81 mg by mouth daily.     [provider]  cetirizine (ZYRTEC) 10 MG tablet Take 10 mg by mouth daily.    [provider]  ERGOCALCIFEROL PO Take 4,000 mg by mouth daily.     [provider]  levothyroxine (SYNTHROID, LEVOTHROID) 25 MCG tablet Take 25 mcg by mouth daily before breakfast.    [provider]  Multiple Vitamin (MULTIVITAMIN WITH MINERALS) TABS tablet Take 1 tablet by mouth daily. One a day women    [provider]  Multiple Vitamins-Minerals (PRESERVISION AREDS) CAPS Take 1 capsule by mouth daily.    [provider]  oxyCODONE (OXY IR/ROXICODONE) 5 MG immediate release tablet Take 0.5-1 tablets (2.5-5 mg total) by mouth every 6 (six) hours as needed for severe pain. Patient not taking: No sig reported 12/02/19   Stark Klein, MD  Polyethyl Glycol-Propyl Glycol (SYSTANE) 0.4-0.3 % SOLN Place  1 drop into both eyes in the morning and at bedtime.    [provider]  pravastatin (PRAVACHOL) 40 MG tablet Take 40 mg by mouth at bedtime.     [provider]  traMADol (ULTRAM) 50 MG tablet Take 1 tablet (50 mg total) by mouth every 6 (six) hours as needed for moderate pain or severe pain. Patient not taking: No sig reported 12/02/19   Stark Klein, MD    Allergies    Tetanus toxoids and Penicillins  Review of Systems   Review of Systems  Constitutional:  Negative for chills and fever.  HENT:  Negative for ear pain and sore throat.   Eyes:  Negative for pain and visual disturbance.  Respiratory:  Negative for cough and shortness of breath.    Cardiovascular:  Negative for chest pain and palpitations.  Gastrointestinal:  Positive for diarrhea. Negative for abdominal pain and vomiting.  Genitourinary:  Negative for dysuria and hematuria.  Musculoskeletal:  Negative for arthralgias and back pain.  Skin:  Negative for color change and rash.  Neurological:  Negative for seizures and syncope.  All other systems reviewed and are negative.  Physical Exam Updated Vital Signs BP (!) 131/59 (BP Location: Right Arm)   Pulse 76   Temp 98.1 F (36.7 C) (Oral)   Resp 16   Wt 45.4 kg   SpO2 95%   BMI 19.53 kg/m   Physical Exam Vitals and nursing note reviewed.  Constitutional:      General: She is not in acute distress.    Appearance: She is well-developed.  HENT:     Head: Normocephalic and atraumatic.  Eyes:     Conjunctiva/sclera: Conjunctivae normal.  Cardiovascular:     Rate and Rhythm: Normal rate and regular rhythm.     Heart sounds: No murmur heard. Pulmonary:     Effort: Pulmonary effort is normal. No respiratory distress.     Breath sounds: Normal breath sounds.  Abdominal:     Palpations: Abdomen is soft.     Tenderness: There is no abdominal tenderness.  Musculoskeletal:     Cervical back: Neck supple.  Skin:    General: Skin is warm and dry.  Neurological:     Mental Status: She is alert.    ED Results / Procedures / Treatments   Labs (all labs ordered are listed, but only abnormal results are displayed) Labs Reviewed - No data to display  EKG None  Radiology No results found.  Procedures Procedures   Medications Ordered in ED Medications  vancomycin (VANCOCIN) capsule 125 mg (has no administration in time range)  sodium chloride 0.9 % bolus 500 mL (500 mLs Intravenous New Bag/Given 12/22/20 [id number])    ED Course  I have reviewed the triage vital signs and the nursing notes.  Pertinent labs & imaging results that were available during my care of the patient were reviewed by me and considered  in my medical decision making (see chart for details).    MDM Rules/Calculators/A&P   8:46 AM 87 yo female presenting to ED for diarrhea. Patient was seen on 9/06, approx 2 days ago, for similar symptoms and tested positive for c.diff-not yet started on therapy. Pt is Aox3, no acute distress, afebrile, with stable vitals. Physical exam demonstrates soft non tender abdomen. No indication for further imaging today. Patient given IV hydration in ED and sent home with prescription of oral vancomycin 125 mg four times a day x 10 days.   Patient in no distress  and overall condition improved here in the ED. Detailed discussions were had with the patient regarding current findings, and need for close f/u with PCP or on call doctor. The patient has been instructed to return immediately if the symptoms worsen in any way for re-evaluation. Patient verbalized understanding and is in agreement with current care plan. All questions answered prior to discharge.         Final Clinical Impression(s) / ED Diagnoses Final diagnoses:  Clostridium difficile colitis    Rx / DC Orders ED Discharge Orders          Ordered    vancomycin (VANCOCIN) 125 MG capsule  4 times daily        12/22/20 0841             Lianne Cure, DO [id number] [phone]

## 2020-12-22 NOTE — ED Triage Notes (Signed)
Pt began experiencing diarrhea on 8/17 and was seen in ED and had a sample taken.  Pt did not hear from the hospital but she checked mychart yesterday and states that it stated that the sample was positive for c-diff.  Pt was not notified by the hospital but after seeing the result she called her PCP who called in a medication but unfortunately she was unable to fill it as the pharmacy did not have the medication.  Pt reports continued diarrhea and feeling worse especially increased generalized waekness and watery diarrhea, pt estimates diarrhea x7 in the last 24 hours

## 2020-12-23 ENCOUNTER — Other Ambulatory Visit: Payer: Self-pay

## 2020-12-23 ENCOUNTER — Inpatient Hospital Stay (HOSPITAL_COMMUNITY)
Admission: EM | Admit: 2020-12-23 | Discharge: 2020-12-26 | DRG: 372 | Disposition: A | Discharge status: Home Health | Payer: Medicare Other | Attending: Internal Medicine | Admitting: Internal Medicine

## 2020-12-23 ENCOUNTER — Emergency Department (HOSPITAL_COMMUNITY): Payer: Medicare Other

## 2020-12-23 ENCOUNTER — Encounter (HOSPITAL_COMMUNITY): Payer: Self-pay

## 2020-12-23 DIAGNOSIS — Z79899 Other long term (current) drug therapy: Secondary | ICD-10-CM | POA: Diagnosis not present

## 2020-12-23 DIAGNOSIS — Z79890 Hormone replacement therapy (postmenopausal): Secondary | ICD-10-CM | POA: Diagnosis not present

## 2020-12-23 DIAGNOSIS — E871 Hypo-osmolality and hyponatremia: Secondary | ICD-10-CM | POA: Diagnosis present

## 2020-12-23 DIAGNOSIS — D6489 Other specified anemias: Secondary | ICD-10-CM | POA: Diagnosis present

## 2020-12-23 DIAGNOSIS — D72828 Other elevated white blood cell count: Secondary | ICD-10-CM | POA: Diagnosis present

## 2020-12-23 DIAGNOSIS — E039 Hypothyroidism, unspecified: Secondary | ICD-10-CM | POA: Diagnosis present

## 2020-12-23 DIAGNOSIS — J189 Pneumonia, unspecified organism: Secondary | ICD-10-CM | POA: Diagnosis present

## 2020-12-23 DIAGNOSIS — A09 Infectious gastroenteritis and colitis, unspecified: Secondary | ICD-10-CM

## 2020-12-23 DIAGNOSIS — Z7982 Long term (current) use of aspirin: Secondary | ICD-10-CM

## 2020-12-23 DIAGNOSIS — Z20822 Contact with and (suspected) exposure to covid-19: Secondary | ICD-10-CM | POA: Diagnosis present

## 2020-12-23 DIAGNOSIS — J9811 Atelectasis: Secondary | ICD-10-CM | POA: Diagnosis present

## 2020-12-23 DIAGNOSIS — E785 Hyperlipidemia, unspecified: Secondary | ICD-10-CM | POA: Diagnosis present

## 2020-12-23 DIAGNOSIS — E860 Dehydration: Secondary | ICD-10-CM | POA: Diagnosis present

## 2020-12-23 DIAGNOSIS — E7800 Pure hypercholesterolemia, unspecified: Secondary | ICD-10-CM | POA: Diagnosis present

## 2020-12-23 DIAGNOSIS — Z90710 Acquired absence of both cervix and uterus: Secondary | ICD-10-CM | POA: Diagnosis not present

## 2020-12-23 DIAGNOSIS — A498 Other bacterial infections of unspecified site: Secondary | ICD-10-CM | POA: Diagnosis present

## 2020-12-23 DIAGNOSIS — A0472 Enterocolitis due to Clostridium difficile, not specified as recurrent: Principal | ICD-10-CM | POA: Diagnosis present

## 2020-12-23 DIAGNOSIS — E876 Hypokalemia: Secondary | ICD-10-CM | POA: Diagnosis present

## 2020-12-23 LAB — CBC WITH DIFFERENTIAL/PLATELET
Abs Immature Granulocytes: 0.08 10*3/uL — ABNORMAL HIGH (ref 0.00–0.07)
Basophils Absolute: 0 10*3/uL (ref 0.0–0.1)
Basophils Relative: 0 %
Eosinophils Absolute: 0 10*3/uL (ref 0.0–0.5)
Eosinophils Relative: 0 %
HCT: 31.2 % — ABNORMAL LOW (ref 36.0–46.0)
Hemoglobin: 10.1 g/dL — ABNORMAL LOW (ref 12.0–15.0)
Immature Granulocytes: 1 %
Lymphocytes Relative: 12 %
Lymphs Abs: 1.6 10*3/uL (ref 0.7–4.0)
MCH: 28.6 pg (ref 26.0–34.0)
MCHC: 32.4 g/dL (ref 30.0–36.0)
MCV: 88.4 fL (ref 80.0–100.0)
Monocytes Absolute: 1.3 10*3/uL — ABNORMAL HIGH (ref 0.1–1.0)
Monocytes Relative: 10 %
Neutro Abs: 10.6 10*3/uL — ABNORMAL HIGH (ref 1.7–7.7)
Neutrophils Relative %: 77 %
Platelets: 231 10*3/uL (ref 150–400)
RBC: 3.53 MIL/uL — ABNORMAL LOW (ref 3.87–5.11)
RDW: 13.2 % (ref 11.5–15.5)
WBC: 13.6 10*3/uL — ABNORMAL HIGH (ref 4.0–10.5)
nRBC: 0 % (ref 0.0–0.2)

## 2020-12-23 LAB — LACTIC ACID, PLASMA: Lactic Acid, Venous: 1.1 mmol/L (ref 0.5–1.9)

## 2020-12-23 LAB — COMPREHENSIVE METABOLIC PANEL
ALT: 14 U/L (ref 0–44)
AST: 22 U/L (ref 15–41)
Albumin: 3 g/dL — ABNORMAL LOW (ref 3.5–5.0)
Alkaline Phosphatase: 54 U/L (ref 38–126)
Anion gap: 11 (ref 5–15)
BUN: 12 mg/dL (ref 8–23)
CO2: 22 mmol/L (ref 22–32)
Calcium: 8.5 mg/dL — ABNORMAL LOW (ref 8.9–10.3)
Chloride: 95 mmol/L — ABNORMAL LOW (ref 98–111)
Creatinine, Ser: 1.04 mg/dL — ABNORMAL HIGH (ref 0.44–1.00)
GFR, Estimated: 50 mL/min — ABNORMAL LOW (ref >60)
Glucose, Bld: 127 mg/dL — ABNORMAL HIGH (ref 70–99)
Potassium: 3.1 mmol/L — ABNORMAL LOW (ref 3.5–5.1)
Sodium: 128 mmol/L — ABNORMAL LOW (ref 135–145)
Total Bilirubin: 0.8 mg/dL (ref 0.3–1.2)
Total Protein: 6 g/dL — ABNORMAL LOW (ref 6.5–8.1)

## 2020-12-23 LAB — RESP PANEL BY RT-PCR (FLU A&B, COVID) ARPGX2
Influenza A by PCR: NEGATIVE
Influenza B by PCR: NEGATIVE
SARS Coronavirus 2 by RT PCR: NEGATIVE

## 2020-12-23 LAB — PROTIME-INR
INR: 1.2 (ref 0.8–1.2)
Prothrombin Time: 15.5 seconds — ABNORMAL HIGH (ref 11.4–15.2)

## 2020-12-23 LAB — APTT: aPTT: 30 seconds (ref 24–36)

## 2020-12-23 MED ORDER — LEVOTHYROXINE SODIUM 25 MCG PO TABS
25.0000 ug | ORAL_TABLET | Freq: Once | ORAL | Status: AC
  Administered 2020-12-23: 25 ug via ORAL
  Filled 2020-12-23: qty 1

## 2020-12-23 MED ORDER — LEVOTHYROXINE SODIUM 25 MCG PO TABS: 25.0000 ug | ORAL_TABLET | Freq: Every day | ORAL | Status: DC

## 2020-12-23 MED ORDER — ACETAMINOPHEN 650 MG RE SUPP: 650.0000 mg | Freq: Four times a day (QID) | RECTAL | Status: DC | PRN

## 2020-12-23 MED ORDER — ADULT MULTIVITAMIN W/MINERALS CH
1.0000 | ORAL_TABLET | Freq: Every day | ORAL | Status: DC
  Filled 2020-12-23 (×3): qty 1

## 2020-12-23 MED ORDER — SENNA 8.6 MG PO TABS
1.0000 | ORAL_TABLET | Freq: Two times a day (BID) | ORAL | Status: DC
  Administered 2020-12-24 – 2020-12-26 (×5): 8.6 mg via ORAL
  Filled 2020-12-23 (×7): qty 1

## 2020-12-23 MED ORDER — ONDANSETRON HCL 4 MG/2ML IJ SOLN
4.0000 mg | Freq: Once | INTRAMUSCULAR | Status: AC
  Administered 2020-12-23: 4 mg via INTRAVENOUS
  Filled 2020-12-23: qty 2

## 2020-12-23 MED ORDER — LORATADINE 10 MG PO TABS: 10.0000 mg | ORAL_TABLET | Freq: Every day | ORAL | Status: DC

## 2020-12-23 MED ORDER — POLYETHYL GLYCOL-PROPYL GLYCOL 0.4-0.3 % OP SOLN: 1.0000 [drp] | OPHTHALMIC | Status: DC

## 2020-12-23 MED ORDER — ENOXAPARIN SODIUM 30 MG/0.3ML IJ SOSY
30.0000 mg | PREFILLED_SYRINGE | Freq: Every day | INTRAMUSCULAR | Status: DC
  Administered 2020-12-23 – 2020-12-26 (×4): 30 mg via SUBCUTANEOUS
  Filled 2020-12-23 (×4): qty 0.3

## 2020-12-23 MED ORDER — LEVOFLOXACIN 500 MG PO TABS: 500.0000 mg | ORAL_TABLET | Freq: Every day | ORAL | Status: DC

## 2020-12-23 MED ORDER — VANCOMYCIN HCL 125 MG PO CAPS
125.0000 mg | ORAL_CAPSULE | Freq: Four times a day (QID) | ORAL | Status: DC
  Administered 2020-12-23 – 2020-12-26 (×14): 125 mg via ORAL
  Filled 2020-12-23 (×16): qty 1

## 2020-12-23 MED ORDER — ASPIRIN EC 81 MG PO TBEC
81.0000 mg | DELAYED_RELEASE_TABLET | Freq: Every day | ORAL | Status: DC
  Administered 2020-12-23 – 2020-12-26 (×4): 81 mg via ORAL
  Filled 2020-12-23 (×4): qty 1

## 2020-12-23 MED ORDER — POLYVINYL ALCOHOL 1.4 % OP SOLN: 1.0000 [drp] | OPHTHALMIC | Status: DC | PRN

## 2020-12-23 MED ORDER — AZITHROMYCIN 250 MG PO TABS
250.0000 mg | ORAL_TABLET | Freq: Every day | ORAL | Status: DC
  Administered 2020-12-24: 250 mg via ORAL
  Filled 2020-12-23: qty 1

## 2020-12-23 MED ORDER — SODIUM CHLORIDE 0.9 % IV BOLUS
500.0000 mL | Freq: Once | INTRAVENOUS | Status: AC
  Administered 2020-12-23: 500 mL via INTRAVENOUS

## 2020-12-23 MED ORDER — AZITHROMYCIN 250 MG PO TABS
500.0000 mg | ORAL_TABLET | Freq: Every day | ORAL | Status: AC
  Administered 2020-12-23: 500 mg via ORAL
  Filled 2020-12-23: qty 2

## 2020-12-23 MED ORDER — SODIUM CHLORIDE 0.9 % IV SOLN
1.0000 g | INTRAVENOUS | Status: DC
  Administered 2020-12-23: 1 g via INTRAVENOUS
  Filled 2020-12-23: qty 1
  Filled 2020-12-23: qty 10

## 2020-12-23 MED ORDER — TRAZODONE HCL 50 MG PO TABS: 25.0000 mg | ORAL_TABLET | Freq: Every evening | ORAL | Status: DC | PRN

## 2020-12-23 MED ORDER — SODIUM CHLORIDE 0.45 % IV SOLN: INTRAVENOUS | Status: DC

## 2020-12-23 MED ORDER — LEVOTHYROXINE SODIUM 50 MCG PO TABS
50.0000 ug | ORAL_TABLET | ORAL | Status: DC
  Administered 2020-12-24 – 2020-12-26 (×2): 50 ug via ORAL
  Filled 2020-12-23 (×3): qty 1

## 2020-12-23 MED ORDER — BOOST / RESOURCE BREEZE PO LIQD CUSTOM
1.0000 | Freq: Three times a day (TID) | ORAL | Status: DC
  Administered 2020-12-23 – 2020-12-26 (×5): 1 via ORAL

## 2020-12-23 MED ORDER — PRAVASTATIN SODIUM 20 MG PO TABS
40.0000 mg | ORAL_TABLET | Freq: Every day | ORAL | Status: DC
  Administered 2020-12-23 – 2020-12-25 (×3): 40 mg via ORAL
  Filled 2020-12-23 (×3): qty 2

## 2020-12-23 MED ORDER — ACETAMINOPHEN 325 MG PO TABS
650.0000 mg | ORAL_TABLET | Freq: Four times a day (QID) | ORAL | Status: DC | PRN
  Administered 2020-12-23 – 2020-12-24 (×2): 650 mg via ORAL
  Filled 2020-12-23 (×2): qty 2

## 2020-12-23 MED ORDER — LEVOTHYROXINE SODIUM 25 MCG PO TABS
25.0000 ug | ORAL_TABLET | ORAL | Status: DC
  Administered 2020-12-25: 25 ug via ORAL
  Filled 2020-12-23: qty 1

## 2020-12-23 NOTE — H&P (Signed)
History and Physical    Kendra Owens [id number] DOB: Mar 22, 1926 DOA: 12/23/2020  PCP: Nicholes Rough, PA-C (Confirm with patient/family/NH records and if not entered, this has to be entered at [facility] point of entry) Patient coming from: home  I have personally briefly reviewed patient's old medical records in [place]  Chief Complaint: diarrhea, weakness  HPI: Kendra Owens is a 87 y.o. female with medical history significant of hypothyroidism, OA presents to the emergency department with complaints of gradual, persistent and progressively worsening diarrhea.  Patient reports that the diarrhea started on 11/30/2020 and several weeks after finishing an antibiotic for urinary tract infection.  Patient and daughter report that the diarrhea was tolerable and patient was stable until 2 days ago when she began to feel weaker.  She has been seen twice in the emergency department since that time and was found to have positive C. difficile cultures.  Yesterday she was given IV hydration and a prescription for oral vancomycin.  Daughter reports she has had 4 doses of the vancomycin but has continued to have profuse, watery diarrhea approximately 8 times per day.  Patient reports that she has never had a problem with incontinence but is currently unable to hold her bowels due to the severe diarrhea.  She denies bloody diarrhea or abdominal pain.  She does endorse associated nausea and decreased appetite.   Patient daughter at bedside reports that prior to Tuesday patient lived independently, worked in the yard every day and was very healthy.  Today patient is so weak that she cannot even turn over in bed without assistance.   ED Course: T 98.3  118/43  HR 66 RR 21. Cmet K 3.1  Na 128  Glucose 127  Cr 1.04; WBC 13.6, Hgb 10.1  77/12/10. CXR with bilateral lower lob atelectasis vs infiltrates. TRH called to admit for continued mgt of diarrhea with hypokalemia, hyponatremia and leukocytosis in setting of  C. Diff colitis and possible pneumonia.  Review of Systems: As per HPI otherwise 10 point review of systems negative.    Past Medical History:  Diagnosis Date   Arthritis    Cancer ([place])    skin   High cholesterol    Hypothyroidism    Macular degeneration    Thyroid disease     Past Surgical History:  Procedure Laterality Date   ABDOMINAL HYSTERECTOMY     BREAST SURGERY     benign cyst removal   EYE SURGERY Bilateral    cataract   INNER EAR SURGERY     MELANOMA EXCISION WITH SENTINEL LYMPH NODE BIOPSY N/A 12/02/2019   Procedure: WIDE LOCAL EXCISION WITH ADVANCEMENT FLAP CLOSURE, SENTINEL LYMPH NODE MAPPING AND BIOPSY FOR RIGHT FOREARM MELANOMA, WIDE LOCAL EXCISION WITH ADVANCEMENT FLAP CLOSURE LEFT LOWER LEG MELANOMA;  Surgeon: Stark Klein, MD;  Location: [facility];  Service: General;  Laterality: N/A;    Soc Hx - lives alone, daughter lives next door. She has 1 duaghter, 1 son, 8 grandchildren,, 9 great-grands. Retired but independent and very active prior to this illness.    reports that she has never smoked. She has never used smokeless tobacco. She reports that she does not drink alcohol and does not use drugs.  Allergies  Allergen Reactions   Other Rash and Swelling   Penicillins Other (See Comments)    Pt sts she is not allergic, but "we lost a little girl from a penicillin shot", so she does not take it. refused Pt sts she is  not allergic, but "we lost a little girl from a penicillin shot", so she does not take it.   Tetanus Toxoids Rash and Swelling    No family history on file.   Prior to Admission medications   Medication Sig Start Date End Date Taking? Authorizing Provider  aspirin 81 MG EC tablet Take 81 mg by mouth daily.     [provider]  cetirizine (ZYRTEC) 10 MG tablet Take 10 mg by mouth daily.    [provider]  ERGOCALCIFEROL PO Take 4,000 mg by mouth daily.     [provider]  levothyroxine (SYNTHROID, LEVOTHROID) 25  MCG tablet Take 25 mcg by mouth daily before breakfast.    [provider]  Multiple Vitamin (MULTIVITAMIN WITH MINERALS) TABS tablet Take 1 tablet by mouth daily. One a day women    [provider]  Multiple Vitamins-Minerals (PRESERVISION AREDS) CAPS Take 1 capsule by mouth daily.    [provider]  oxyCODONE (OXY IR/ROXICODONE) 5 MG immediate release tablet Take 0.5-1 tablets (2.5-5 mg total) by mouth every 6 (six) hours as needed for severe pain. Patient not taking: No sig reported 12/02/19   Stark Klein, MD  Polyethyl Glycol-Propyl Glycol (SYSTANE) 0.4-0.3 % SOLN Place 1 drop into both eyes in the morning and at bedtime.    [provider]  pravastatin (PRAVACHOL) 40 MG tablet Take 40 mg by mouth at bedtime.     [provider]  traMADol (ULTRAM) 50 MG tablet Take 1 tablet (50 mg total) by mouth every 6 (six) hours as needed for moderate pain or severe pain. Patient not taking: No sig reported 12/02/19   Stark Klein, MD  vancomycin (VANCOCIN) 125 MG capsule Take 1 capsule (125 mg total) by mouth 4 (four) times daily for 10 days. 12/22/20 [id number]  Kendra Cure, DO    Physical Exam: Vitals:   12/23/20 0501 12/23/20 0545 12/23/20 0600 12/23/20 0900  BP:  (!) 111/54 123/60 (!) 118/43  Pulse: 90 84 85 66  Resp: (!) 23 17 (!) 23 (!) 21  Temp:      TempSrc:      SpO2: 94% 97% 99% 98%  Weight:      Height:         Vitals:   12/23/20 0501 12/23/20 0545 12/23/20 0600 12/23/20 0900  BP:  (!) 111/54 123/60 (!) 118/43  Pulse: 90 84 85 66  Resp: (!) 23 17 (!) 23 (!) 21  Temp:      TempSrc:      SpO2: 94% 97% 99% 98%  Weight:      Height:       General: Very thin, elderly woman in no distress, but pale and weak.  Eyes: PERRL, lids and conjunctivae normal ENMT: Mucous membranes are dry. Edentulous with dentures. Daughter reports there is an ulcer in the gum ridge. Neck: normal, supple, no masses, no thyromegaly Respiratory: Normal  respiratory effort. No accessory muscle use. Bibasilar rales R>L, no wheezing. Cardiovascular: Regular rate and rhythm, no murmurs / rubs / gallops. No extremity edema. 2+ pedal pulses. No carotid bruits.  Abdomen: no tenderness, no masses palpated. No hepatosplenomegaly. Bowel sounds positive.  Musculoskeletal: no clubbing / cyanosis. No joint deformity upper and lower extremities. Good ROM, no contractures. Decreased muscle tone.  Skin: no rashes, lesions, ulcers. No induration Neurologic: CN 2-12 grossly intact. Strength 4/5 in all 4.  Psychiatric: Normal judgment and insight. Alert and oriented x 3. Normal mood.  Labs on Admission: I have personally reviewed following labs and imaging studies  CBC: Recent Labs  Lab 12/20/20 1237 12/23/20 0256  WBC 9.2 13.6*  NEUTROABS  --  10.6*  HGB 10.2* 10.1*  HCT 31.6* 31.2*  MCV 87.5 88.4  PLT 232 [id number]   Basic Metabolic Panel: Recent Labs  Lab 12/20/20 1237 12/23/20 0256  NA 132* 128*  K 3.5 3.1*  CL 98 95*  CO2 26 22  GLUCOSE 92 127*  BUN 12 12  CREATININE 0.96 1.04*  CALCIUM 9.3 8.5*   GFR: Estimated Creatinine Clearance: 24.2 mL/min (A) (by C-G formula based on SCr of 1.04 mg/dL (H)). Liver Function Tests: Recent Labs  Lab 12/20/20 1237 12/23/20 0256  AST 20 22  ALT 10 14  ALKPHOS 50 54  BILITOT 0.4 0.8  PROT 6.2* 6.0*  ALBUMIN 3.3* 3.0*   Recent Labs  Lab 12/20/20 1237  LIPASE 21   No results for input(s): AMMONIA in the last 168 hours. Coagulation Profile: Recent Labs  Lab 12/23/20 0330  INR 1.2   Cardiac Enzymes: No results for input(s): CKTOTAL, CKMB, CKMBINDEX, TROPONINI in the last 168 hours. BNP (last 3 results) No results for input(s): PROBNP in the last 8760 hours. HbA1C: No results for input(s): HGBA1C in the last 72 hours. CBG: No results for input(s): GLUCAP in the last 168 hours. Lipid Profile: No results for input(s): CHOL, HDL, LDLCALC, TRIG, CHOLHDL, LDLDIRECT in the last 72  hours. Thyroid Function Tests: No results for input(s): TSH, T4TOTAL, FREET4, T3FREE, THYROIDAB in the last 72 hours. Anemia Panel: No results for input(s): VITAMINB12, FOLATE, FERRITIN, TIBC, IRON, RETICCTPCT in the last 72 hours. Urine analysis:    Component Value Date/Time   COLORURINE YELLOW 12/20/2020 1502   APPEARANCEUR CLEAR 12/20/2020 1502   LABSPEC 1.009 12/20/2020 1502   PHURINE 5.5 12/20/2020 1502   GLUCOSEU NEGATIVE 12/20/2020 1502   HGBUR TRACE (A) 12/20/2020 1502   BILIRUBINUR NEGATIVE 12/20/2020 1502   KETONESUR NEGATIVE 12/20/2020 1502   PROTEINUR TRACE (A) 12/20/2020 1502   UROBILINOGEN 0.2 05/25/2013 1545   NITRITE NEGATIVE 12/20/2020 1502   LEUKOCYTESUR NEGATIVE 12/20/2020 1502    Radiological Exams on Admission: DG Chest Port 1 View  Result Date: 12/23/2020 CLINICAL DATA:  Questionable sepsis.  Fever. EXAM: PORTABLE CHEST 1 VIEW COMPARISON:  05/25/2013 FINDINGS: Low lung volumes. Bilateral lower lobe airspace opacities, left greater than right. Heart is normal size. No effusions or acute bony abnormality. IMPRESSION: Low lung volumes with bibasilar atelectasis or infiltrates. Electronically Signed   By: Rolm Baptise M.D.   On: 12/23/2020 03:44    EKG: Independently reviewed. SR, RBBB, LAFB, no acute changes. Unchanged from previous.  Assessment/Plan Active Problems:   Clostridioides difficile infection   Diarrhea associated with pseudomembranous colitis   Community acquired pneumonia   Dehydration, moderate   C. Diff - diagnosed prior to admission. Plan Continue oral Vancomycin  2. Diarrhea/Dehydration - 2/2 C. Diff. Received IVF bolus in ED Plan Continue IVF fluid at moderate rate.  3. CAP - patient with new bibasilar infiltrates, productive cough, increased SOB and leukocytosis c/w pneumonia. Not respiratory distress or hypoxemia. Plan Procalcitonin  Levaquin 500 mg PO daily  4. Hypothyroidism - last TSH 11/18/20 4.46 Plan Continue home  medication.  5. Code status - discussed with patient and daughter. They understand and accept the low chance of success and the potential complications of successful resuscitation including prolong ICU care, high mortality risk and high risk of readmission and death  if she should survive to leave the hospital  DVT prophylaxis: lovenox  Code Status: full code  Family Communication: daughter present during interview and exam. Answered all questions.  Disposition Plan: Home when medically stable  Consults called: none  Admission status: inpatient    Adella Hare MD [facility] Pager [phone]  If 7PM-7AM, please contact night-coverage [email or web address] Password [email or web address]  12/23/2020, 10:28 AM

## 2020-12-23 NOTE — ED Provider Notes (Signed)
[facility] DEPT Provider Note   CSN: [id number] Arrival date & time: 12/23/20  0221     History Chief Complaint  Patient presents with   Diarrhea    Kendra Owens is a 87 y.o. female presents to the emergency department with complaints of gradual, persistent and progressively worsening diarrhea.  Patient reports that the diarrhea started on 11/30/2020 and several weeks after finishing an antibiotic for urinary tract infection.  Patient and daughter report that the diarrhea was tolerable and patient was stable until 2 days ago when she began to feel weaker.  She has been seen twice in the emergency department since that time and was found to have positive C. difficile cultures.  Yesterday she was given IV hydration and a prescription for oral vancomycin.  Daughter reports she has had 4 doses of the vancomycin but has continued to have profuse, watery diarrhea approximately 8 times per day.  Patient reports that she has never had a problem with incontinence but is currently unable to hold her bowels due to the severe diarrhea.  She denies bloody diarrhea or abdominal pain.  She does endorse associated nausea and decreased appetite.  Patient daughter at bedside reports that prior to Tuesday patient lived independently, worked in the yard every day and was very healthy.  Today patient is so weak that she cannot even turn over in bed without assistance.  The history is provided by the patient, medical records and a relative (daughter). No language interpreter was used.      Past Medical History:  Diagnosis Date   Arthritis    Cancer ([place])    skin   High cholesterol    Hypothyroidism    Macular degeneration    Thyroid disease     Patient Active Problem List   Diagnosis Date Noted   Clostridioides difficile infection 12/23/2020   Malignant melanoma of arm, right ([place]) 12/25/2019    Past Surgical History:  Procedure Laterality Date   ABDOMINAL  HYSTERECTOMY     BREAST SURGERY     benign cyst removal   EYE SURGERY Bilateral    cataract   INNER EAR SURGERY     MELANOMA EXCISION WITH SENTINEL LYMPH NODE BIOPSY N/A 12/02/2019   Procedure: WIDE LOCAL EXCISION WITH ADVANCEMENT FLAP CLOSURE, SENTINEL LYMPH NODE MAPPING AND BIOPSY FOR RIGHT FOREARM MELANOMA, WIDE LOCAL EXCISION WITH ADVANCEMENT FLAP CLOSURE LEFT LOWER LEG MELANOMA;  Surgeon: Stark Klein, MD;  Location: [facility];  Service: General;  Laterality: N/A;     OB History   No obstetric history on file.     No family history on file.  Social History   Tobacco Use   Smoking status: Never   Smokeless tobacco: Never  Vaping Use   Vaping Use: Never used  Substance Use Topics   Alcohol use: No   Drug use: No    Home Medications Prior to Admission medications   Medication Sig Start Date End Date Taking? Authorizing Provider  aspirin 81 MG EC tablet Take 81 mg by mouth daily.     [provider]  cetirizine (ZYRTEC) 10 MG tablet Take 10 mg by mouth daily.    [provider]  ERGOCALCIFEROL PO Take 4,000 mg by mouth daily.     [provider]  levothyroxine (SYNTHROID, LEVOTHROID) 25 MCG tablet Take 25 mcg by mouth daily before breakfast.    [provider]  Multiple Vitamin (MULTIVITAMIN WITH MINERALS) TABS tablet Take 1 tablet by mouth daily.  One a day women    [provider]  Multiple Vitamins-Minerals (PRESERVISION AREDS) CAPS Take 1 capsule by mouth daily.    [provider]  oxyCODONE (OXY IR/ROXICODONE) 5 MG immediate release tablet Take 0.5-1 tablets (2.5-5 mg total) by mouth every 6 (six) hours as needed for severe pain. Patient not taking: No sig reported 12/02/19   Stark Klein, MD  Polyethyl Glycol-Propyl Glycol (SYSTANE) 0.4-0.3 % SOLN Place 1 drop into both eyes in the morning and at bedtime.    [provider]  pravastatin (PRAVACHOL) 40 MG tablet Take 40 mg by mouth at bedtime.     [provider]  traMADol (ULTRAM) 50 MG tablet Take 1 tablet (50 mg total) by mouth every 6 (six) hours as needed for moderate pain or severe pain. Patient not taking: No sig reported 12/02/19   Stark Klein, MD  vancomycin (VANCOCIN) 125 MG capsule Take 1 capsule (125 mg total) by mouth 4 (four) times daily for 10 days. 12/22/20 [id number]  Lianne Cure, DO    Allergies    Other, Penicillins, and Tetanus toxoids  Review of Systems   Review of Systems  Constitutional:  Positive for appetite change (decreased) and fatigue. Negative for diaphoresis, fever and unexpected weight change.  HENT:  Negative for mouth sores.   Eyes:  Negative for visual disturbance.  Respiratory:  Negative for cough, chest tightness, shortness of breath and wheezing.   Cardiovascular:  Negative for chest pain.  Gastrointestinal:  Positive for diarrhea. Negative for abdominal pain, constipation, nausea and vomiting.  Endocrine: Negative for polydipsia, polyphagia and polyuria.  Genitourinary:  Negative for dysuria, frequency, hematuria and urgency.  Musculoskeletal:  Negative for back pain and neck stiffness.  Skin:  Negative for rash.  Allergic/Immunologic: Negative for immunocompromised state.  Neurological:  Positive for weakness (generalized). Negative for syncope, light-headedness and headaches.  Hematological:  Does not bruise/bleed easily.  Psychiatric/Behavioral:  Negative for sleep disturbance. The patient is not nervous/anxious.    Physical Exam Updated Vital Signs BP (!) 108/56   Pulse 87   Temp 98.3 F (36.8 C) (Oral)   Resp 17   Ht 5' (1.524 m)   Wt 45.4 kg   SpO2 91%   BMI 19.53 kg/m   Physical Exam Vitals and nursing note reviewed.  Constitutional:      General: She is not in acute distress.    Appearance: She is ill-appearing. She is not diaphoretic.  HENT:     Head: Normocephalic.     Mouth/Throat:     Mouth: Mucous membranes are dry.  Eyes:     General: No scleral icterus.     Conjunctiva/sclera: Conjunctivae normal.  Cardiovascular:     Rate and Rhythm: Normal rate and regular rhythm.     Pulses: Normal pulses.          Radial pulses are 2+ on the right side and 2+ on the left side.  Pulmonary:     Effort: No tachypnea, accessory muscle usage, prolonged expiration, respiratory distress or retractions.     Breath sounds: No stridor.     Comments: Equal chest rise. No increased work of breathing. Abdominal:     General: There is no distension.     Palpations: Abdomen is soft.     Tenderness: There is no abdominal tenderness. There is no guarding or rebound.  Musculoskeletal:     Cervical back: Normal range of motion.     Comments: Moves all extremities equally and without  difficulty.  Skin:    General: Skin is warm and dry.     Capillary Refill: Capillary refill takes less than 2 seconds.     Coloration: Skin is pale.  Neurological:     Mental Status: She is alert.     GCS: GCS eye subscore is 4. GCS verbal subscore is 5. GCS motor subscore is 6.     Comments: Speech is clear and goal oriented.  Psychiatric:        Mood and Affect: Mood normal.    ED Results / Procedures / Treatments   Labs (all labs ordered are listed, but only abnormal results are displayed) Labs Reviewed  CBC WITH DIFFERENTIAL/PLATELET - Abnormal; Notable for the following components:      Result Value   WBC 13.6 (*)    RBC 3.53 (*)    Hemoglobin 10.1 (*)    HCT 31.2 (*)    Neutro Abs 10.6 (*)    Monocytes Absolute 1.3 (*)    Abs Immature Granulocytes 0.08 (*)    All other components within normal limits  COMPREHENSIVE METABOLIC PANEL - Abnormal; Notable for the following components:   Sodium 128 (*)    Potassium 3.1 (*)    Chloride 95 (*)    Glucose, Bld 127 (*)    Creatinine, Ser 1.04 (*)    Calcium 8.5 (*)    Total Protein 6.0 (*)    Albumin 3.0 (*)    GFR, Estimated 50 (*)    All other components within normal limits  PROTIME-INR - Abnormal; Notable for the  following components:   Prothrombin Time 15.5 (*)    All other components within normal limits  RESP PANEL BY RT-PCR (FLU A&B, COVID) ARPGX2  CULTURE, BLOOD (SINGLE)  LACTIC ACID, PLASMA  APTT  URINALYSIS, ROUTINE W REFLEX MICROSCOPIC    EKG EKG Interpretation  Date/Time:  Friday December 23 2020 03:38:31 EDT Ventricular Rate:  86 PR Interval:  151 QRS Duration: 143 QT Interval:  383 QTC Calculation: 459 R Axis:   -42 Text Interpretation: Sinus rhythm Atrial premature complex RBBB and LAFB No significant change since last tracing Confirmed by Addison Lank [phone]) on 12/23/2020 4:19:50 AM  Radiology DG Chest Port 1 View  Result Date: 12/23/2020 CLINICAL DATA:  Questionable sepsis.  Fever. EXAM: PORTABLE CHEST 1 VIEW COMPARISON:  05/25/2013 FINDINGS: Low lung volumes. Bilateral lower lobe airspace opacities, left greater than right. Heart is normal size. No effusions or acute bony abnormality. IMPRESSION: Low lung volumes with bibasilar atelectasis or infiltrates. Electronically Signed   By: Rolm Baptise M.D.   On: 12/23/2020 03:44    Procedures Procedures   Medications Ordered in ED Medications  vancomycin (VANCOCIN) capsule 125 mg (has no administration in time range)  sodium chloride 0.9 % bolus 500 mL (has no administration in time range)  ondansetron (ZOFRAN) injection 4 mg (4 mg Intravenous Given 12/23/20 0417)  sodium chloride 0.9 % bolus 500 mL (0 mLs Intravenous Stopped 12/23/20 0521)    ED Course  I have reviewed the triage vital signs and the nursing notes.  Pertinent labs & imaging results that were available during my care of the patient were reviewed by me and considered in my medical decision making (see chart for details).    MDM Rules/Calculators/A&P                           Patient presents with ongoing and worsening diarrhea and generalized weakness.  Pale and ill-appearing on exam.  Abdomen soft and nontender.  Dry mucous membranes.  Given ongoing and  worsening diarrhea in the setting of worsening generalized weakness patient will need admission.  Additionally, patient found to have leukocytosis new from yesterday.  Additionally, labs with worsening dehydration and malnutrition.  Decreasing serum sodium, potassium and chloride.  Increasing serum creatinine.  Fluids ordered.  Patient will be admitted for hydration and further treatment of her C. difficile.   5:26 AM Discussed with Dr. Cyd Silence who will admit.  Will continue oral vancomycin as pt is tolerating PO and this is her first diagnosis.    Final Clinical Impression(s) / ED Diagnoses Final diagnoses:  Diarrhea of infectious origin  Dehydration  C. difficile diarrhea    Rx / DC Orders ED Discharge Orders     None        Vi Whitesel, Gwenlyn Perking 12/23/20 0527    Fatima Blank, MD 12/24/20 [phone]

## 2020-12-23 NOTE — ED Notes (Signed)
Provider at bedside

## 2020-12-23 NOTE — ED Notes (Signed)
Patient had bowel movement. Patient stated she cannot provide only a urine sample because when she pushes both the urine and feces come out.

## 2020-12-23 NOTE — ED Triage Notes (Signed)
Pt reports diarrhea since 11/30/2020. Stool specimen resulted C.Diff +. Multiple trips to ER. Pt daughter says pt too tired to roll over anymore. Loss of control of bowels.

## 2020-12-23 NOTE — Progress Notes (Signed)
Initial Nutrition Assessment  INTERVENTION:   -Boost Breeze po TID, each supplement provides 250 kcal and 9 grams of protein   -Multivitamin with minerals daily  NUTRITION DIAGNOSIS:   Increased nutrient needs related to acute illness as evidenced by estimated needs.  GOAL:   Patient will meet greater than or equal to 90% of their needs  MONITOR:   PO intake, Supplement acceptance, Labs, Weight trends, I & O's  REASON FOR ASSESSMENT:   Malnutrition Screening Tool    ASSESSMENT:   88 y.o. female with medical history significant of hypothyroidism, OA presents to the emergency department with complaints of gradual, persistent and progressively worsening diarrhea.  Patient reports that the diarrhea started on 11/30/2020 and several weeks after finishing an antibiotic for urinary tract infection.  Patient has had persistent diarrhea since starting antibiotics to treat a UTI. Symptoms began 8/17. Pt had not been eating as well either d/t nausea and poor appetite. Will order Boost Breeze supplements for additional kcals and protein.   Per weight records, pt with insignificant weight changes over the past year.  Medications reviewed.  Labs reviewed:  Low Na, K C.diff +  NUTRITION - FOCUSED PHYSICAL EXAM:  Deferred   Diet Order:   Diet Order             Diet regular Room service appropriate? Yes; Fluid consistency: Thin  Diet effective now                   EDUCATION NEEDS:   No education needs have been identified at this time  Skin:  Skin Assessment: Reviewed RN Assessment  Last BM:  9/9 -type 7  Height:   Ht Readings from Last 1 Encounters:  12/23/20 5' (1.524 m)    Weight:   Wt Readings from Last 1 Encounters:  12/23/20 45.4 kg    BMI:  Body mass index is 19.53 kg/m.  Estimated Nutritional Needs:   Kcal:  1250-1450  Protein:  60-75g  Fluid:  1.6L/day   Clayton Bibles, MS, RD, LDN Inpatient Clinical Dietitian Contact information  available via Amion

## 2020-12-24 LAB — BASIC METABOLIC PANEL
Anion gap: 8 (ref 5–15)
BUN: 9 mg/dL (ref 8–23)
CO2: 23 mmol/L (ref 22–32)
Calcium: 8.4 mg/dL — ABNORMAL LOW (ref 8.9–10.3)
Chloride: 104 mmol/L (ref 98–111)
Creatinine, Ser: 0.98 mg/dL (ref 0.44–1.00)
GFR, Estimated: 54 mL/min — ABNORMAL LOW (ref >60)
Glucose, Bld: 94 mg/dL (ref 70–99)
Potassium: 2.8 mmol/L — ABNORMAL LOW (ref 3.5–5.1)
Sodium: 135 mmol/L (ref 135–145)

## 2020-12-24 LAB — CBC
HCT: 26.6 % — ABNORMAL LOW (ref 36.0–46.0)
Hemoglobin: 8.8 g/dL — ABNORMAL LOW (ref 12.0–15.0)
MCH: 28.9 pg (ref 26.0–34.0)
MCHC: 33.1 g/dL (ref 30.0–36.0)
MCV: 87.5 fL (ref 80.0–100.0)
Platelets: 204 10*3/uL (ref 150–400)
RBC: 3.04 MIL/uL — ABNORMAL LOW (ref 3.87–5.11)
RDW: 13.3 % (ref 11.5–15.5)
WBC: 9.3 10*3/uL (ref 4.0–10.5)
nRBC: 0 % (ref 0.0–0.2)

## 2020-12-24 LAB — PROCALCITONIN: Procalcitonin: 0.39 ng/mL

## 2020-12-24 MED ORDER — SODIUM CHLORIDE 0.9 % IV SOLN: INTRAVENOUS | Status: DC

## 2020-12-24 NOTE — Evaluation (Signed)
Physical Therapy Evaluation Patient Details Name: Kendra Owens MRN: [id number] DOB: [birth year] Today's Date: 12/24/2020   History of Present Illness  87 y.o. female  presented to the emergency department 12/23/20 with complaints of gradual, persistent and progressively worsening diarrhea.  Patient reports that the diarrhea started on 11/30/2020 and several weeks after finishing an antibiotic for urinary tract infection. Dx of cdiff, PNA. Pt with medical history significant of hypothyroidism, OA.  Clinical Impression  Pt admitted with above diagnosis. Pt ambulated 320' with hand held assist and holding IV pole. Min steadying assist while walking. Encouraged pt to use RW for now due to mild unsteadiness while ambulating.  Pt currently with functional limitations due to the deficits listed below (see PT Problem List). Pt will benefit from skilled PT to increase their independence and safety with mobility to allow discharge to the venue listed below.       Follow Up Recommendations Home health PT; supervision for mobility    Equipment Recommendations  None recommended by PT    Recommendations for Other Services       Precautions / Restrictions Precautions Precautions: Fall Precaution Comments: pt denies h/o falls in past 1 year Restrictions Weight Bearing Restrictions: No      Mobility  Bed Mobility               General bed mobility comments: up in recliner    Transfers Overall transfer level: Needs assistance Equipment used: 1 person hand held assist Transfers: Sit to/from Stand Sit to Stand: Min assist         General transfer comment: min steadying assist  Ambulation/Gait Ambulation/Gait assistance: Min assist Gait Distance (Feet): 320 Feet Assistive device: 1 person hand held assist;IV Pole Gait Pattern/deviations: Step-through pattern;Decreased stride length Gait velocity: WFL   General Gait Details: IV pole and HHA of 1 for balance (pt declined RW),  min/guard to min A for steadying assist  Stairs            Wheelchair Mobility    Modified Rankin (Stroke Patients Only)       Balance Overall balance assessment: Needs assistance   Sitting balance-Leahy Scale: Good     Standing balance support: Single extremity supported Standing balance-Leahy Scale: Poor Standing balance comment: single UE support static, BUE support dynamic                             Pertinent Vitals/Pain Pain Assessment: No/denies pain    Home Living Family/patient expects to be discharged to:: Private residence Living Arrangements: Alone Available Help at Discharge: Family;Available 24 hours/day   Home Access: Stairs to enter Entrance Stairs-Rails: Right;Left Entrance Stairs-Number of Steps: 4 Home Layout: One level Home Equipment: Walker - 2 wheels Additional Comments: pt's children live very close by and provide transportation for her    Prior Function Level of Independence: Independent         Comments: doesn't drive 2* macular degeneration     Hand Dominance        Extremity/Trunk Assessment   Upper Extremity Assessment Upper Extremity Assessment: Overall WFL for tasks assessed    Lower Extremity Assessment Lower Extremity Assessment: Overall WFL for tasks assessed    Cervical / Trunk Assessment Cervical / Trunk Assessment: Kyphotic  Communication      Cognition Arousal/Alertness: Awake/alert Behavior During Therapy: WFL for tasks assessed/performed Overall Cognitive Status: Within Functional Limits for tasks assessed  General Comments      Exercises     Assessment/Plan    PT Assessment Patient needs continued PT services  PT Problem List Decreased mobility;Decreased activity tolerance;Decreased balance;Decreased knowledge of use of DME       PT Treatment Interventions Gait training;Functional mobility training;Therapeutic  activities;Patient/family education;Therapeutic exercise    PT Goals (Current goals can be found in the Care Plan section)  Acute Rehab PT Goals Patient Stated Goal: return home, get stronger PT Goal Formulation: With patient Time For Goal Achievement: 01/07/21 Potential to Achieve Goals: Good    Frequency Min 3X/week   Barriers to discharge        Co-evaluation               AM-PAC PT "6 Clicks" Mobility  Outcome Measure Help needed turning from your back to your side while in a flat bed without using bedrails?: None Help needed moving from lying on your back to sitting on the side of a flat bed without using bedrails?: A Little Help needed moving to and from a bed to a chair (including a wheelchair)?: A Little Help needed standing up from a chair using your arms (e.g., wheelchair or bedside chair)?: A Little Help needed to walk in hospital room?: A Little Help needed climbing 3-5 steps with a railing? : A Little 6 Click Score: 19    End of Session Equipment Utilized During Treatment: Gait belt Activity Tolerance: Patient tolerated treatment well Patient left: in chair;with call bell/phone within reach;with chair alarm set Nurse Communication: Mobility status PT Visit Diagnosis: Difficulty in walking, not elsewhere classified (R26.2)    Time: [id number] PT Time Calculation (min) (ACUTE ONLY): 14 min   Charges:   PT Evaluation $PT Eval Moderate Complexity: 1 Mod         Philomena Doheny PT 12/24/2020  Acute Rehabilitation Services Pager [phone] Office [phone]

## 2020-12-24 NOTE — Progress Notes (Signed)
PROGRESS NOTE  Kendra Owens  DOB: May 27, 1925  PCP: Nicholes Rough, PA-C [id number]  DOA: 12/23/2020  LOS: 1 day  Hospital Day: 2   Chief Complaint  Patient presents with   Diarrhea    Brief narrative: Kendra Owens is a 87 y.o. female with PMH significant for HLD, hypothyroidism, osteoarthritis, macular degeneration. Patient presented to the ED on 9/9 with complaint of gradual, progressive and persistent diarrhea that started on 8/17, several weeks after finishing an antibiotic for UTI.  She also has associated poor oral intake, progressive weakness. Since then, she was seen in the ED twice on 9/6 and 9/8.  She was given IV hydration, stool studies showed C. difficile positive.  She was discharged on oral vancomycin. However at home, despite 4 doses of vancomycin, she continued to have profuse, frequent watery diarrhea and hence returned back to ED on 9/9. At baseline, patient was in a good state of health, lives independently, works in the yard every day.  In the ED, she was hemodynamically stable. Labs with sodium 128, potassium 3.1. C. difficile assay from 9/6 positive for antigen and toxin Chest x-ray with bilateral lower lobe atelectasis versus infiltrates Patient was admitted to hospitalist service for further evaluation management  Subjective: Patient was seen and examined this morning.  Pleasant elderly Caucasian female.  Sitting up in chair.  Not in distress.  No new symptoms.  Feels better.  Wants to get up and walk. Chart reviewed Afebrile, blood pressure normal, on 2 L oxygen by nasal cannula. Labs this morning with potassium further down to 2.8, hemoglobin low at 8.8  Assessment/Plan: Acute C. difficile diarrhea -Currently on oral vancomycin 125 mg 4 times a day for 2 weeks course. -Continue IV hydration with normal saline at 75 mill per hour -I would avoid Imodium at this time for next 24 to 48 hours.  Abnormal chest x-ray -Chest x-ray on admission showed  bilateral lower lobe infiltrates.  Likely atelectasis.   -Patient has no respiratory symptoms.  WBC count normalized.  I will stop antibiotics at this time while patient is struggling with C. difficile diarrhea. Recent Labs  Lab 12/20/20 1237 12/23/20 0256 12/23/20 0330 12/24/20 0432 12/24/20 0919  WBC 9.2 13.6*  --  9.3  --   LATICACIDVEN  --   --  1.1  --   --   PROCALCITON  --   --   --   --  0.39   Acute on chronic anemia -Hemoglobin at baseline more than 10, dropped to 8.8 this morning.  No active bleeding.  Probably dilutional.  Continue to monitor. Recent Labs    12/20/20 1237 12/23/20 0256 12/24/20 0432  HGB 10.2* 10.1* 8.8*  MCV 87.5 88.4 87.5   Hypothyroidism Continue Synthroid  Hyperlipidemia -Continue aspirin 81 mg daily, pravastatin 40 mg at bedtime,  Mobility: Previously independent.  PT eval ordered Code Status:   Code Status: Full Code  Nutritional status: Body mass index is 19.53 kg/m. Nutrition Problem: Increased nutrient needs Etiology: acute illness Signs/Symptoms: estimated needs Diet:  Diet Order             Diet regular Room service appropriate? Yes; Fluid consistency: Thin  Diet effective now                  DVT prophylaxis:  enoxaparin (LOVENOX) injection 30 mg Start: 12/23/20 1100   Antimicrobials: Oral vancomycin Fluid: Normal saline 100 mill per hour Consultants: None Family Communication: None at bedside  Status is:  Inpatient  Remains inpatient appropriate because: Needs diarrhea monitored, needs IV hydration  Dispo: The patient is from: Home              Anticipated d/c is to: Pending PT eval              Patient currently is not medically stable to d/c.   Difficult to place patient No     Infusions:   sodium chloride 100 mL/hr at 12/24/20 1000    Scheduled Meds:  aspirin EC  81 mg Oral Daily   enoxaparin (LOVENOX) injection  30 mg Subcutaneous Daily   feeding supplement  1 Container Oral TID BM   [START ON  12/25/2020] levothyroxine  25 mcg Oral Q48H   And   levothyroxine  50 mcg Oral Q48H   multivitamin with minerals  1 tablet Oral Daily   pravastatin  40 mg Oral QHS   senna  1 tablet Oral BID   vancomycin  125 mg Oral QID    Antimicrobials: Anti-infectives (From admission, onward)    Start     Dose/Rate Route Frequency Ordered Stop   12/24/20 1000  azithromycin (ZITHROMAX) tablet 250 mg  Status:  Discontinued       See Hyperspace for full Linked Orders Report.   250 mg Oral Daily 12/23/20 1110 12/24/20 1206   12/23/20 1200  cefTRIAXone (ROCEPHIN) 1 g in sodium chloride 0.9 % 100 mL IVPB  Status:  Discontinued        1 g 200 mL/hr over 30 Minutes Intravenous Every 24 hours 12/23/20 1110 12/24/20 1206   12/23/20 1200  azithromycin (ZITHROMAX) tablet 500 mg       See Hyperspace for full Linked Orders Report.   500 mg Oral Daily 12/23/20 1110 12/23/20 1246   12/23/20 1000  vancomycin (VANCOCIN) capsule 125 mg        125 mg Oral 4 times daily 12/23/20 0526 01/02/21 0959   12/23/20 1000  levofloxacin (LEVAQUIN) tablet 500 mg  Status:  Discontinued        500 mg Oral Daily 12/23/20 0945 12/23/20 1110       PRN meds: acetaminophen **OR** acetaminophen, polyvinyl alcohol, traZODone   Objective: Vitals:   12/23/20 2113 12/24/20 0607  BP: 124/67 (!) 121/55  Pulse: 81 93  Resp: 18 16  Temp: 98.3 F (36.8 C) 98.9 F (37.2 C)  SpO2: 98% 90%    Intake/Output Summary (Last 24 hours) at 12/24/2020 1209 Last data filed at 12/24/2020 1000 Gross per 24 hour  Intake 1582.69 ml  Output 600 ml  Net 982.69 ml   Filed Weights   12/23/20 0251  Weight: 45.4 kg   Weight change:  Body mass index is 19.53 kg/m.   Physical Exam: General exam: Pleasant, elderly thin built Caucasian female.  Not in physical distress Skin: No rashes, lesions or ulcers. HEENT: Atraumatic, normocephalic, no obvious bleeding Lungs: Clear to auscultation bilaterally CVS: Regular rate and rhythm, no  murmur GI/Abd soft, nontender, mild diffuse tenderness, bowel sound present CNS: Alert, awake, oriented x3.  Hard of hearing Psychiatry: Mood appropriate Extremities: No pedal edema, no calf tenderness  Data Review: I have personally reviewed the laboratory data and studies available.  Recent Labs  Lab 12/20/20 1237 12/23/20 0256 12/24/20 0432  WBC 9.2 13.6* 9.3  NEUTROABS  --  10.6*  --   HGB 10.2* 10.1* 8.8*  HCT 31.6* 31.2* 26.6*  MCV 87.5 88.4 87.5  PLT 232 231 204   Recent Labs  Lab 12/20/20 1237 12/23/20 0256 12/24/20 0432  NA 132* 128* 135  K 3.5 3.1* 2.8*  CL 98 95* 104  CO2 '26 22 23  '$ GLUCOSE 92 127* 94  BUN '12 12 9  '$ CREATININE 0.96 1.04* 0.98  CALCIUM 9.3 8.5* 8.4*    F/u labs ordered Unresulted Labs (From admission, onward)     Start     Ordered   12/30/20 0500  Creatinine, serum  (enoxaparin (LOVENOX)    CrCl < 30 ml/min)  Weekly,   R     Comments: while on enoxaparin therapy.    12/23/20 0945   12/30/20 0500  Creatinine, serum  (enoxaparin (LOVENOX)    CrCl < 30 ml/min)  Weekly,   R     Comments: while on enoxaparin therapy.    12/23/20 0945   12/25/20 [id number]  Basic metabolic panel  Daily,   R      12/24/20 0913   12/25/20 0500  CBC with Differential/Platelet  Daily,   R      12/24/20 0913   12/25/20 0500  Magnesium  Tomorrow morning,   STAT        12/24/20 0913   12/25/20 0500  Phosphorus  Tomorrow morning,   R        12/24/20 0913   12/25/20 0500  Procalcitonin  Daily,   R      12/24/20 0913   12/23/20 0322  Urinalysis, Routine w reflex microscopic  (Undifferentiated presentation (screening labs and basic nursing orders))  ONCE - STAT,   STAT        12/23/20 0321            Signed, Terrilee Croak, MD [facility] 12/24/2020

## 2020-12-24 NOTE — Evaluation (Signed)
Occupational Therapy Evaluation Patient Details Name: Kendra Owens MRN: [id number] DOB: 1925/08/30 Today's Date: 12/24/2020    History of Present Illness 87 y.o. female  presented to the emergency department 12/23/20 with complaints of gradual, persistent and progressively worsening diarrhea.  Patient reports that the diarrhea started on 11/30/2020 and several weeks after finishing an antibiotic for urinary tract infection. Dx of cdiff, PNA. Pt with medical history significant of hypothyroidism, OA.   Clinical Impression   Kendra Owens is an 87 year old woman who presents with a decrease strength compared to her normal but demonstrates functional ability to perform ambulation and ADLs. Patient demonstrated ability to don lower body clothing, perform toileting and ambulate in hall without overt loss of balance. Recommend intermittent supervision at home. No further OT needs.    Follow Up Recommendations  No OT follow up;Supervision - Intermittent    Equipment Recommendations  None recommended by OT    Recommendations for Other Services       Precautions / Restrictions Precautions Precautions: Fall Precaution Comments: pt denies h/o falls in past 1 year Restrictions Weight Bearing Restrictions: No      Mobility Bed Mobility Overal bed mobility: Modified Independent             General bed mobility comments: up in recliner    Transfers Overall transfer level: Modified independent Equipment used: 1 person hand held assist Transfers: Sit to/from Stand Sit to Stand: Min assist         General transfer comment: Patient pushed IV pole but no physical assistance. Would do better without IV pole.    Balance Overall balance assessment: Mild deficits observed, not formally tested   Sitting balance-Leahy Scale: Good     Standing balance support: Single extremity supported Standing balance-Leahy Scale: Poor Standing balance comment: single UE support static, BUE  support dynamic                           ADL either performed or assessed with clinical judgement   ADL Overall ADL's : Modified independent                                             Vision Baseline Vision/History: 6 Macular Degeneration Patient Visual Report: No change from baseline       Perception     Praxis      Pertinent Vitals/Pain Pain Assessment: No/denies pain     Hand Dominance Right   Extremity/Trunk Assessment Upper Extremity Assessment Upper Extremity Assessment: Overall WFL for tasks assessed   Lower Extremity Assessment Lower Extremity Assessment: Overall WFL for tasks assessed   Cervical / Trunk Assessment Cervical / Trunk Assessment: Kyphotic   Communication Communication Communication: No difficulties   Cognition Arousal/Alertness: Awake/alert Behavior During Therapy: WFL for tasks assessed/performed Overall Cognitive Status: Within Functional Limits for tasks assessed                                     General Comments       Exercises     Shoulder Instructions      Home Living Family/patient expects to be discharged to:: Private residence Living Arrangements: Alone Available Help at Discharge: Family;Available 24 hours/day   Home Access: Stairs to enter [employer]  of Steps: 4 Entrance Stairs-Rails: Right;Left Home Layout: One level               Home Equipment: Walker - 2 wheels   Additional Comments: pt's children live very close by and provide transportation for her      Prior Functioning/Environment Level of Independence: Independent        Comments: doesn't drive 2* macular degeneration        OT Problem List: Impaired vision/perception;Impaired balance (sitting and/or standing);Decreased strength      OT Treatment/Interventions:      OT Goals(Current goals can be found in the care plan section) Acute Rehab OT Goals Patient Stated Goal: return  home, get stronger OT Goal Formulation: All assessment and education complete, DC therapy  OT Frequency:     Barriers to D/C:            Co-evaluation              AM-PAC OT "6 Clicks" Daily Activity     Outcome Measure Help from another person eating meals?: None Help from another person taking care of personal grooming?: None Help from another person toileting, which includes using toliet, bedpan, or urinal?: None Help from another person bathing (including washing, rinsing, drying)?: None Help from another person to put on and taking off regular upper body clothing?: None Help from another person to put on and taking off regular lower body clothing?: None 6 Click Score: 24   End of Session Equipment Utilized During Treatment: Gait belt Nurse Communication:  (Okay to see)  Activity Tolerance: Patient tolerated treatment well Patient left: in bed;with call bell/phone within reach;with family/visitor present;with bed alarm set  OT Visit Diagnosis: Muscle weakness (generalized) (M62.81)                Time: [id number] OT Time Calculation (min): 20 min Charges:  OT General Charges $OT Visit: 1 Visit OT Evaluation $OT Eval Low Complexity: 1 Low  Artha Chiasson, OTR/L [place]  Office [phone] Pager: [phone]   Lenward Chancellor 12/24/2020, 3:54 PM

## 2020-12-25 LAB — CBC WITH DIFFERENTIAL/PLATELET
Abs Immature Granulocytes: 0.03 10*3/uL (ref 0.00–0.07)
Basophils Absolute: 0 10*3/uL (ref 0.0–0.1)
Basophils Relative: 1 %
Eosinophils Absolute: 0.2 10*3/uL (ref 0.0–0.5)
Eosinophils Relative: 4 %
HCT: 25.7 % — ABNORMAL LOW (ref 36.0–46.0)
Hemoglobin: 8.5 g/dL — ABNORMAL LOW (ref 12.0–15.0)
Immature Granulocytes: 1 %
Lymphocytes Relative: 30 %
Lymphs Abs: 2 10*3/uL (ref 0.7–4.0)
MCH: 28.7 pg (ref 26.0–34.0)
MCHC: 33.1 g/dL (ref 30.0–36.0)
MCV: 86.8 fL (ref 80.0–100.0)
Monocytes Absolute: 0.7 10*3/uL (ref 0.1–1.0)
Monocytes Relative: 10 %
Neutro Abs: 3.7 10*3/uL (ref 1.7–7.7)
Neutrophils Relative %: 54 %
Platelets: 203 10*3/uL (ref 150–400)
RBC: 2.96 MIL/uL — ABNORMAL LOW (ref 3.87–5.11)
RDW: 13.2 % (ref 11.5–15.5)
WBC: 6.6 10*3/uL (ref 4.0–10.5)
nRBC: 0 % (ref 0.0–0.2)

## 2020-12-25 LAB — BASIC METABOLIC PANEL
Anion gap: 7 (ref 5–15)
BUN: 7 mg/dL — ABNORMAL LOW (ref 8–23)
CO2: 24 mmol/L (ref 22–32)
Calcium: 8.2 mg/dL — ABNORMAL LOW (ref 8.9–10.3)
Chloride: 104 mmol/L (ref 98–111)
Creatinine, Ser: 0.87 mg/dL (ref 0.44–1.00)
GFR, Estimated: >60 mL/min (ref >60)
Glucose, Bld: 92 mg/dL (ref 70–99)
Potassium: 2.6 mmol/L — CL (ref 3.5–5.1)
Sodium: 135 mmol/L (ref 135–145)

## 2020-12-25 LAB — PROCALCITONIN: Procalcitonin: 0.24 ng/mL

## 2020-12-25 LAB — PHOSPHORUS: Phosphorus: 2.5 mg/dL (ref 2.5–4.6)

## 2020-12-25 LAB — MAGNESIUM: Magnesium: 1.5 mg/dL — ABNORMAL LOW (ref 1.7–2.4)

## 2020-12-25 MED ORDER — POTASSIUM CHLORIDE CRYS ER 20 MEQ PO TBCR
40.0000 meq | EXTENDED_RELEASE_TABLET | ORAL | Status: AC
  Administered 2020-12-25 (×2): 40 meq via ORAL
  Filled 2020-12-25 (×2): qty 2

## 2020-12-25 NOTE — Progress Notes (Signed)
PROGRESS NOTE  Kendra Owens  DOB: December 03, 1925  PCP: Nicholes Rough, PA-C [id number]  DOA: 12/23/2020  LOS: 2 days  Hospital Day: 3   Chief Complaint  Patient presents with   Diarrhea    Brief narrative: Kendra Owens is a 87 y.o. female with PMH significant for HLD, hypothyroidism, osteoarthritis, macular degeneration. Patient presented to the ED on 9/9 with complaint of gradual, progressive and persistent diarrhea that started on 8/17, several weeks after finishing an antibiotic for UTI.  She also has associated poor oral intake, progressive weakness. Since then, she was seen in the ED twice on 9/6 and 9/8.  She was given IV hydration, stool studies showed C. difficile positive.  She was discharged on oral vancomycin. However at home, despite 4 doses of vancomycin, she continued to have profuse, frequent watery diarrhea and hence returned back to ED on 9/9. At baseline, patient was in a good state of health, lives independently, works in the yard every day.  In the ED, she was hemodynamically stable. Labs with sodium 128, potassium 3.1. C. difficile assay from 9/6 positive for antigen and toxin Chest x-ray with bilateral lower lobe atelectasis versus infiltrates Patient was admitted to hospitalist service for further evaluation management  Subjective: Patient was seen and examined this morning.  Propped up in bed.  Not in distress.  No new symptoms.  States that diarrhea is improving.  Assessment/Plan: Acute C. difficile diarrhea -Currently on oral vancomycin 125 mg 4 times a day for 2 weeks course. -Patient reports improvement in the number of diarrhea episodes. -Currently on IV hydration with normal saline at 75 mill per hour.  I would continue it for next 24 hours.  Abnormal chest x-ray -Chest x-ray on admission showed bilateral lower lobe infiltrates.  Likely atelectasis.   -Patient has no respiratory symptoms.  WBC count normalized.  I stopped antibiotics on  9/10. Recent Labs  Lab 12/20/20 1237 12/23/20 0256 12/23/20 0330 12/24/20 0432 12/24/20 0919 12/25/20 0449  WBC 9.2 13.6*  --  9.3  --  6.6  LATICACIDVEN  --   --  1.1  --   --   --   PROCALCITON  --   --   --   --  0.39 0.24   Acute on chronic anemia -Hemoglobin at baseline more than 10, dropped to 8.5 this morning.  No active bleeding.  Probably dilutional.  Continue to monitor. Recent Labs    12/20/20 1237 12/23/20 0256 12/24/20 0432 12/25/20 0449  HGB 10.2* 10.1* 8.8* 8.5*  MCV 87.5 88.4 87.5 86.8   Hypothyroidism Continue Synthroid  Hyperlipidemia -Continue aspirin 81 mg daily, pravastatin 40 mg at bedtime,  Mobility: Previously independent.  PT eval obtained. Code Status:   Code Status: Full Code  Nutritional status: Body mass index is 19.53 kg/m. Nutrition Problem: Increased nutrient needs Etiology: acute illness Signs/Symptoms: estimated needs Diet:  Diet Order             Diet regular Room service appropriate? Yes; Fluid consistency: Thin  Diet effective now                  DVT prophylaxis:  enoxaparin (LOVENOX) injection 30 mg Start: 12/23/20 1100   Antimicrobials: Oral vancomycin Fluid: Normal saline 75 mill per hour Consultants: None Family Communication: None at bedside  Status is: Inpatient  Remains inpatient appropriate because: Needs diarrhea monitored, needs IV hydration  Dispo: The patient is from: Home  Anticipated d/c is to: Home with home health              Patient currently is not medically stable to d/c.   Difficult to place patient No     Infusions:   sodium chloride 100 mL/hr at 12/24/20 1740    Scheduled Meds:  aspirin EC  81 mg Oral Daily   enoxaparin (LOVENOX) injection  30 mg Subcutaneous Daily   feeding supplement  1 Container Oral TID BM   levothyroxine  25 mcg Oral Q48H   And   levothyroxine  50 mcg Oral Q48H   multivitamin with minerals  1 tablet Oral Daily   pravastatin  40 mg Oral QHS    senna  1 tablet Oral BID   vancomycin  125 mg Oral QID    Antimicrobials: Anti-infectives (From admission, onward)    Start     Dose/Rate Route Frequency Ordered Stop   12/24/20 1000  azithromycin (ZITHROMAX) tablet 250 mg  Status:  Discontinued       See Hyperspace for full Linked Orders Report.   250 mg Oral Daily 12/23/20 1110 12/24/20 1206   12/23/20 1200  cefTRIAXone (ROCEPHIN) 1 g in sodium chloride 0.9 % 100 mL IVPB  Status:  Discontinued        1 g 200 mL/hr over 30 Minutes Intravenous Every 24 hours 12/23/20 1110 12/24/20 1206   12/23/20 1200  azithromycin (ZITHROMAX) tablet 500 mg       See Hyperspace for full Linked Orders Report.   500 mg Oral Daily 12/23/20 1110 12/23/20 1246   12/23/20 1000  vancomycin (VANCOCIN) capsule 125 mg        125 mg Oral 4 times daily 12/23/20 0526 01/02/21 0959   12/23/20 1000  levofloxacin (LEVAQUIN) tablet 500 mg  Status:  Discontinued        500 mg Oral Daily 12/23/20 0945 12/23/20 1110       PRN meds: acetaminophen **OR** acetaminophen, polyvinyl alcohol, traZODone   Objective: Vitals:   12/24/20 2126 12/25/20 0519  BP: 130/78 117/63  Pulse: 95 72  Resp: 16 15  Temp:  98.3 F (36.8 C)  SpO2: (!) 82% 95%    Intake/Output Summary (Last 24 hours) at 12/25/2020 1301 Last data filed at 12/25/2020 1000 Gross per 24 hour  Intake 2427.67 ml  Output --  Net 2427.67 ml   Filed Weights   12/23/20 0251  Weight: 45.4 kg   Weight change:  Body mass index is 19.53 kg/m.   Physical Exam: General exam: Pleasant, elderly thin built Caucasian female.  Not in physical distress Skin: No rashes, lesions or ulcers. HEENT: Atraumatic, normocephalic, no obvious bleeding Lungs: Clear to auscultation bilaterally CVS: Regular rate and rhythm, no murmur GI/Abd soft, nontender, mild diffuse tenderness, bowel sound present CNS: Alert, awake, oriented x3.  Hard of hearing Psychiatry: Mood appropriate Extremities: No pedal edema, no calf  tenderness  Data Review: I have personally reviewed the laboratory data and studies available.  Recent Labs  Lab 12/20/20 1237 12/23/20 0256 12/24/20 0432 12/25/20 0449  WBC 9.2 13.6* 9.3 6.6  NEUTROABS  --  10.6*  --  3.7  HGB 10.2* 10.1* 8.8* 8.5*  HCT 31.6* 31.2* 26.6* 25.7*  MCV 87.5 88.4 87.5 86.8  PLT 232 231 204 203   Recent Labs  Lab 12/20/20 1237 12/23/20 0256 12/24/20 0432 12/25/20 0449  NA 132* 128* 135 135  K 3.5 3.1* 2.8* 2.6*  CL 98 95* 104 104  CO2  $'26 22 23 24  'c$ GLUCOSE 92 127* 94 92  BUN '12 12 9 '$ 7*  CREATININE 0.96 1.04* 0.98 0.87  CALCIUM 9.3 8.5* 8.4* 8.2*  MG  --   --   --  1.5*  PHOS  --   --   --  2.5    F/u labs ordered Unresulted Labs (From admission, onward)     Start     Ordered   12/30/20 0500  Creatinine, serum  (enoxaparin (LOVENOX)    CrCl < 30 ml/min)  Weekly,   R     Comments: while on enoxaparin therapy.    12/23/20 0945   12/30/20 0500  Creatinine, serum  (enoxaparin (LOVENOX)    CrCl < 30 ml/min)  Weekly,   R     Comments: while on enoxaparin therapy.    12/23/20 0945   12/25/20 [id number]  Basic metabolic panel  Daily,   R      12/24/20 0913   12/25/20 0500  CBC with Differential/Platelet  Daily,   R      12/24/20 0913   12/25/20 0500  Procalcitonin  Daily,   R      12/24/20 0913   12/23/20 0322  Urinalysis, Routine w reflex microscopic  (Undifferentiated presentation (screening labs and basic nursing orders))  ONCE - STAT,   STAT        12/23/20 0321            Signed, Terrilee Croak, MD [facility] 12/25/2020

## 2020-12-26 LAB — CBC WITH DIFFERENTIAL/PLATELET
Band Neutrophils: 0 %
Basophils Relative: 0 %
Blasts: NONE SEEN %
Eosinophils Relative: 6 %
HCT: 26.2 % — ABNORMAL LOW (ref 36.0–46.0)
Hemoglobin: 8.6 g/dL — ABNORMAL LOW (ref 12.0–15.0)
Lymphocytes Relative: 20 %
MCH: 28.7 pg (ref 26.0–34.0)
MCHC: 32.8 g/dL (ref 30.0–36.0)
MCV: 87.3 fL (ref 80.0–100.0)
Metamyelocytes Relative: NONE SEEN %
Monocytes Relative: 4 %
Myelocytes: NONE SEEN %
Neutrophils Relative %: 70 %
Platelets: 215 10*3/uL (ref 150–400)
Promyelocytes Relative: NONE SEEN %
RBC Morphology: NORMAL
RBC: 3 MIL/uL — ABNORMAL LOW (ref 3.87–5.11)
RDW: 13.4 % (ref 11.5–15.5)
WBC Morphology: NORMAL
WBC: 6.5 10*3/uL (ref 4.0–10.5)
nRBC: 0 % (ref 0.0–0.2)
nRBC: 1 /100 WBC — ABNORMAL HIGH

## 2020-12-26 LAB — BASIC METABOLIC PANEL
Anion gap: 0 — ABNORMAL LOW (ref 5–15)
BUN: <5 mg/dL — ABNORMAL LOW (ref 8–23)
CO2: 23 mmol/L (ref 22–32)
Calcium: 7.9 mg/dL — ABNORMAL LOW (ref 8.9–10.3)
Chloride: 113 mmol/L — ABNORMAL HIGH (ref 98–111)
Creatinine, Ser: 0.71 mg/dL (ref 0.44–1.00)
GFR, Estimated: >60 mL/min (ref >60)
Glucose, Bld: 87 mg/dL (ref 70–99)
Potassium: 3.4 mmol/L — ABNORMAL LOW (ref 3.5–5.1)
Sodium: 136 mmol/L (ref 135–145)

## 2020-12-26 LAB — PROCALCITONIN: Procalcitonin: 0.13 ng/mL

## 2020-12-26 MED ORDER — POTASSIUM CHLORIDE CRYS ER 20 MEQ PO TBCR
40.0000 meq | EXTENDED_RELEASE_TABLET | Freq: Once | ORAL | Status: AC
  Administered 2020-12-26: 40 meq via ORAL
  Filled 2020-12-26: qty 2

## 2020-12-26 MED ORDER — GUAIFENESIN ER 600 MG PO TB12
600.0000 mg | ORAL_TABLET | Freq: Two times a day (BID) | ORAL | Status: DC
  Administered 2020-12-26: 600 mg via ORAL

## 2020-12-26 MED ORDER — GUAIFENESIN ER 600 MG PO TB12: 600.0000 mg | ORAL_TABLET | Freq: Two times a day (BID) | ORAL | 0 refills | Status: AC

## 2020-12-26 NOTE — Progress Notes (Signed)
Discharge instructions discussed with patient and family, verbalized agreement and understanding 

## 2020-12-26 NOTE — TOC Transition Note (Signed)
Transition of Care [facility]) - CM/SW Discharge Note  Patient Details  Name: Kendra Owens MRN: [id number] Date of Birth: Sep 22, 1925  Transition of [facility]) CM/SW Contact:  Sherie Don, LCSW Phone Number: 12/26/2020, 1:42 PM  Clinical Narrative: PT recommended HHPT. CSW made [facility] referral to [facility], but the agency declined referral due to staffing limitations in the [facility] office. CSW made referral to Amy with [facility], which was accepted. HH orders are in. CSW updated patient and RN. TOC signing off.  Final next level of care: [place] Barriers to Discharge: No Barriers Identified  Patient Goals and CMS Choice Patient states their goals for this hospitalization and ongoing recovery are:: Discharge home with [place] CMS Medicare.gov Compare Post Acute Care list provided to:: Patient Choice offered to / list presented to : Patient  Discharge Plan and Services         DME Arranged: N/A DME Agency: NA HH Arranged: PT HH Agency: [place] Date [place]: 12/26/20 Time [place]: 1117 Representative spoke with at [place]: Amy  Readmission Risk Interventions No flowsheet data found.

## 2020-12-26 NOTE — Discharge Summary (Addendum)
Physician Discharge Summary  Kendra Owens [id number] DOB: [birth year] DOA: 12/23/2020  PCP: Nicholes Rough, PA-C  Admit date: 12/23/2020 Discharge date: 12/26/2020  Admitted From: Home Discharge disposition: Home with home health PT   Code Status: Full Code   Discharge Diagnosis:   Active Problems:   Clostridioides difficile infection   Diarrhea associated with pseudomembranous colitis   Dehydration, moderate   Community acquired pneumonia    Chief Complaint  Patient presents with   Diarrhea    Brief narrative: Kendra Owens is a 87 y.o. female with PMH significant for HLD, hypothyroidism, osteoarthritis, macular degeneration. Patient presented to the ED on 9/9 with complaint of gradual, progressive and persistent diarrhea that started on 8/17, several weeks after finishing an antibiotic for UTI.  She also has associated poor oral intake, progressive weakness. Since then, she was seen in the ED twice on 9/6 and 9/8.  She was given IV hydration, stool studies showed C. difficile positive.  She was discharged on oral vancomycin. However at home, despite 4 doses of vancomycin, she continued to have profuse, frequent watery diarrhea and hence returned back to ED on 9/9. At baseline, patient was in a good state of health, lives independently, works in the yard every day.  In the ED, she was hemodynamically stable. Labs with sodium 128, potassium 3.1. C. difficile assay from 9/6 positive for antigen and toxin Chest x-ray with bilateral lower lobe atelectasis versus infiltrates Patient was admitted to hospitalist service for further evaluation management  Subjective: Patient was seen and examined this morning.  Sitting up in chair.  Not in distress.  Diarrhea improved.  Feels better.  Wants to go home.  Assessment/Plan: Acute C. difficile diarrhea -Currently on oral vancomycin 125 mg 4 times a day for 2 weeks course. -Patient reports improvement in the number of diarrhea  episodes.  Adequately hydrated.  Wants to go home.  She says she already has vancomycin oral at home for 10 days.  It was given in last ED visit 2 days prior to this presentation.    Abnormal chest x-ray -Chest x-ray on admission showed bilateral lower lobe infiltrates.  Likely atelectasis.   -Patient has no respiratory symptoms.  WBC count normalized.  I stopped antibiotics on 9/10. Recent Labs  Lab 12/20/20 1237 12/23/20 0256 12/23/20 0330 12/24/20 0432 12/24/20 0919 12/25/20 0449 12/26/20 0353  WBC 9.2 13.6*  --  9.3  --  6.6 6.5  LATICACIDVEN  --   --  1.1  --   --   --   --   PROCALCITON  --   --   --   --  0.39 0.24 0.13   Acute on chronic anemia -Hemoglobin at baseline more than 10, dropped to 8.5 this morning.  No active bleeding.  Probably dilutional.  Continue to monitor as an outpatient. Recent Labs    12/20/20 1237 12/23/20 0256 12/24/20 0432 12/25/20 0449 12/26/20 0353  HGB 10.2* 10.1* 8.8* 8.5* 8.6*  MCV 87.5 88.4 87.5 86.8 87.3   Hypothyroidism Continue Synthroid  Hyperlipidemia -Continue aspirin 81 mg daily, pravastatin 40 mg at bedtime.   Allergies as of 12/26/2020       Reactions   Other Rash, Swelling   Penicillins Other (See Comments)   Pt sts she is not allergic, but "we lost a little girl from a penicillin shot", so she does not take it. refused Pt sts she is not allergic, but "we lost a little girl from a penicillin shot", so  she does not take it.   Tetanus Toxoids Rash, Swelling        Medication List     STOP taking these medications    cetirizine 10 MG tablet Commonly known as: ZYRTEC       TAKE these medications    acetaminophen 500 MG tablet Commonly known as: TYLENOL Take 500 mg by mouth every 6 (six) hours as needed for mild pain, fever or headache.   aspirin 81 MG EC tablet Take 81 mg by mouth daily.   ERGOCALCIFEROL PO Take 2 tablets by mouth daily.   guaiFENesin 600 MG 12 hr tablet Commonly known as:  MUCINEX Take 1 tablet (600 mg total) by mouth 2 (two) times daily for 7 days.   levothyroxine 50 MCG tablet Commonly known as: SYNTHROID Take 25-50 mcg by mouth daily. 15mg daily alternating with 259m daily What changed: Another medication with the same name was removed. Continue taking this medication, and follow the directions you see here.   multivitamin with minerals Tabs tablet Take 1 tablet by mouth daily. One a day women   pravastatin 40 MG tablet Commonly known as: PRAVACHOL Take 40 mg by mouth at bedtime.   PreserVision AREDS Caps Take 1 capsule by mouth daily.   Systane 0.4-0.3 % Soln Generic drug: Polyethyl Glycol-Propyl Glycol Place 1 drop into both eyes as needed (dry eyes).   vancomycin 125 MG capsule Commonly known as: Vancocin Take 1 capsule (125 mg total) by mouth 4 (four) times daily for 10 days.        Discharge Instructions:  Diet Recommendation:  Discharge Diet Orders (From admission, onward)     Start     Ordered   12/26/20 0000  Diet general        12/26/20 1053              Follow with Primary MD BeNicholes RoughPA-C in 7 days   Get CBC/BMP checked in next visit within 1 week by PCP or SNF MD ( we routinely change or add medications that can affect your baseline labs and fluid status, therefore we recommend that you get the mentioned basic workup next visit with your PCP, your PCP may decide not to get them or add new tests based on their clinical decision)  On your next visit with your PCP, please Get Medicines reviewed and adjusted.  Please request your PCP  to go over all Hospital Tests and Procedure/Radiological results at the follow up, please get all Hospital records sent to your Prim MD by signing hospital release before you go home.  Activity: As tolerated with Full fall precautions use walker/cane & assistance as needed  For Heart failure patients - Check your Weight same time everyday, if you gain over 2 pounds, or you develop  in leg swelling, experience more shortness of breath or chest pain, call your Primary MD immediately. Follow Cardiac Low Salt Diet and 1.5 lit/day fluid restriction.  If you have smoked or chewed Tobacco in the last 2 yrs please stop smoking, stop any regular Alcohol  and or any Recreational drug use.  If you experience worsening of your admission symptoms, develop shortness of breath, life threatening emergency, suicidal or homicidal thoughts you must seek medical attention immediately by calling 911 or calling your MD immediately  if symptoms less severe.  You Must read complete instructions/literature along with all the possible adverse reactions/side effects for all the Medicines you take and that have been prescribed to you. Take  any new Medicines after you have completely understood and accpet all the possible adverse reactions/side effects.   Do not drive, operate heavy machinery, perform activities at heights, swimming or participation in water activities or provide baby sitting services if your were admitted for syncope or siezures until you have seen by Primary MD or a Neurologist and advised to do so again.  Do not drive when taking Pain medications.  Do not take more than prescribed Pain, Sleep and Anxiety Medications  Wear Seat belts while driving.   Please note You were cared for by a hospitalist during your hospital stay. If you have any questions about your discharge medications or the care you received while you were in the hospital after you are discharged, you can call the unit and asked to speak with the hospitalist on call if the hospitalist that took care of you is not available. Once you are discharged, your primary care physician will handle any further medical issues. Please note that NO REFILLS for any discharge medications will be authorized once you are discharged, as it is imperative that you return to your primary care physician (or establish a relationship with a  primary care physician if you do not have one) for your aftercare needs so that they can reassess your need for medications and monitor your lab values.    Follow ups:    Follow-up Information     Nicholes Rough, PA-C Follow up.   Specialty: Physician Assistant Contact information: [place] [address] [phone]                 Wound care:   Incision (Closed) 12/02/19 Leg Left (Active)  Date First Assessed/Time First Assessed: 12/02/19 0949   Location: Leg  Location Orientation: Left    Assessments 12/02/2019 10:50 AM 12/02/2019 12:23 PM  Dressing Type Gauze (Comment);Compression wrap Gauze (Comment)  Dressing Clean;Dry;Intact Clean;Dry;Intact  Site / Wound Assessment Dressing in place / Unable to assess Dressing in place / Unable to assess  Drainage Amount None None     No Linked orders to display     Incision (Closed) 12/02/19 Arm Right (Active)  Date First Assessed/Time First Assessed: 12/02/19 0953   Location: Arm  Location Orientation: Right    Assessments 12/02/2019 10:50 AM 12/02/2019 12:23 PM  Dressing Type Gauze (Comment) Gauze (Comment)  Dressing Clean;Dry;Intact Clean;Dry;Intact  Site / Wound Assessment Dressing in place / Unable to assess --  Drainage Amount Scant None  Drainage Description Serosanguineous --     No Linked orders to display     Incision (Closed) 12/02/19 Axilla Right (Active)  Date First Assessed/Time First Assessed: 12/02/19 1025   Location: Axilla  Location Orientation: Right    Assessments 12/02/2019 10:50 AM 12/02/2019 12:23 PM  Dressing Type Liquid skin adhesive Liquid skin adhesive  Dressing Clean;Dry;Intact Clean;Intact;Dry  Site / Wound Assessment Clean;Dry Clean;Dry  Margins Attached edges (approximated) Attached edges (approximated)  Closure Skin glue Skin glue  Drainage Amount None None     No Linked orders to display    Discharge Exam:   Vitals:   12/25/20 0519 12/25/20 1419 12/25/20 2049 12/26/20  0630  BP: 117/63 (!) 149/73 128/73 (!) 145/68  Pulse: 72 86 87 74  Resp: '15 15 18 18  '$ Temp: 98.3 F (36.8 C) 97.7 F (36.5 C) 98.7 F (37.1 C) 97.6 F (36.4 C)  TempSrc: Oral Oral Oral Oral  SpO2: 95% 92% 92% 95%  Weight:      Height:  Body mass index is 19.53 kg/m.  General exam: Pleasant, elderly Caucasian female.  Not in physical distress Skin: No rashes, lesions or ulcers. HEENT: Atraumatic, normocephalic, no obvious bleeding Lungs: Clear to auscultation bilaterally CVS: Regular rate and rhythm, no murmur GI/Abd soft, nontender, nondistended, bowel sound present CNS: Alert, awake, oriented x3 Psychiatry: Mood appropriate Extremities: No pedal edema, no calf tenderness  Time coordinating discharge: 35 minutes   The results of significant diagnostics from this hospitalization (including imaging, microbiology, ancillary and laboratory) are listed below for reference.    Procedures and Diagnostic Studies:   DG Chest Port 1 View  Result Date: 12/23/2020 CLINICAL DATA:  Questionable sepsis.  Fever. EXAM: PORTABLE CHEST 1 VIEW COMPARISON:  05/25/2013 FINDINGS: Low lung volumes. Bilateral lower lobe airspace opacities, left greater than right. Heart is normal size. No effusions or acute bony abnormality. IMPRESSION: Low lung volumes with bibasilar atelectasis or infiltrates. Electronically Signed   By: Rolm Baptise M.D.   On: 12/23/2020 03:44     Labs:   Basic Metabolic Panel: Recent Labs  Lab 12/20/20 1237 12/23/20 0256 12/24/20 0432 12/25/20 0449 12/26/20 0353  NA 132* 128* 135 135 136  K 3.5 3.1* 2.8* 2.6* 3.4*  CL 98 95* 104 104 113*  CO2 '26 22 23 24 23  '$ GLUCOSE 92 127* 94 92 87  BUN '12 12 9 '$ 7* <5*  CREATININE 0.96 1.04* 0.98 0.87 0.71  CALCIUM 9.3 8.5* 8.4* 8.2* 7.9*  MG  --   --   --  1.5*  --   PHOS  --   --   --  2.5  --    GFR Estimated Creatinine Clearance: 31.5 mL/min (by C-G formula based on SCr of 0.71 mg/dL). Liver Function Tests: Recent  Labs  Lab 12/20/20 1237 12/23/20 0256  AST 20 22  ALT 10 14  ALKPHOS 50 54  BILITOT 0.4 0.8  PROT 6.2* 6.0*  ALBUMIN 3.3* 3.0*   Recent Labs  Lab 12/20/20 1237  LIPASE 21   No results for input(s): AMMONIA in the last 168 hours. Coagulation profile Recent Labs  Lab 12/23/20 0330  INR 1.2    CBC: Recent Labs  Lab 12/20/20 1237 12/23/20 0256 12/24/20 0432 12/25/20 0449 12/26/20 0353  WBC 9.2 13.6* 9.3 6.6 6.5  NEUTROABS  --  10.6*  --  3.7  --   HGB 10.2* 10.1* 8.8* 8.5* 8.6*  HCT 31.6* 31.2* 26.6* 25.7* 26.2*  MCV 87.5 88.4 87.5 86.8 87.3  PLT 232 231 204 203 215   Cardiac Enzymes: No results for input(s): CKTOTAL, CKMB, CKMBINDEX, TROPONINI in the last 168 hours. BNP: Invalid input(s): POCBNP CBG: No results for input(s): GLUCAP in the last 168 hours. D-Dimer No results for input(s): DDIMER in the last 72 hours. Hgb A1c No results for input(s): HGBA1C in the last 72 hours. Lipid Profile No results for input(s): CHOL, HDL, LDLCALC, TRIG, CHOLHDL, LDLDIRECT in the last 72 hours. Thyroid function studies No results for input(s): TSH, T4TOTAL, T3FREE, THYROIDAB in the last 72 hours.  Invalid input(s): FREET3 Anemia work up No results for input(s): VITAMINB12, FOLATE, FERRITIN, TIBC, IRON, RETICCTPCT in the last 72 hours. Microbiology Recent Results (from the past 240 hour(s))  Gastrointestinal Panel by PCR , Stool     Status: None   Collection Time: 12/20/20  3:02 PM   Specimen: Stool  Result Value Ref Range Status   Campylobacter species NOT DETECTED NOT DETECTED Final   Plesimonas shigelloides NOT DETECTED NOT DETECTED Final  Salmonella species NOT DETECTED NOT DETECTED Final   Yersinia enterocolitica NOT DETECTED NOT DETECTED Final   Vibrio species NOT DETECTED NOT DETECTED Final   Vibrio cholerae NOT DETECTED NOT DETECTED Final   Enteroaggregative E coli (EAEC) NOT DETECTED NOT DETECTED Final   Enteropathogenic E coli (EPEC) NOT DETECTED NOT  DETECTED Final   Enterotoxigenic E coli (ETEC) NOT DETECTED NOT DETECTED Final   Shiga like toxin producing E coli (STEC) NOT DETECTED NOT DETECTED Final   Shigella/Enteroinvasive E coli (EIEC) NOT DETECTED NOT DETECTED Final   Cryptosporidium NOT DETECTED NOT DETECTED Final   Cyclospora cayetanensis NOT DETECTED NOT DETECTED Final   Entamoeba histolytica NOT DETECTED NOT DETECTED Final   Giardia lamblia NOT DETECTED NOT DETECTED Final   Adenovirus F40/41 NOT DETECTED NOT DETECTED Final   Astrovirus NOT DETECTED NOT DETECTED Final   Norovirus GI/GII NOT DETECTED NOT DETECTED Final   Rotavirus A NOT DETECTED NOT DETECTED Final   Sapovirus (I, II, IV, and V) NOT DETECTED NOT DETECTED Final    Comment: Performed at [facility], [address] [place], [place] [address]  C Difficile Quick Screen w PCR reflex     Status: Abnormal   Collection Time: 12/20/20  3:02 PM   Specimen: Stool  Result Value Ref Range Status   C Diff antigen POSITIVE (A) NEGATIVE Final   C Diff toxin POSITIVE (A) NEGATIVE Final   C Diff interpretation Toxin producing C. difficile detected.  Final    Comment: CRITICAL RESULT CALLED TO, READ BACK BY AND VERIFIED WITH: RN [address] [phone] FCP Performed at [facility], [place] [address] [place], [place] [address]   Blood culture (routine single)     Status: None (Preliminary result)   Collection Time: 12/23/20  3:30 AM   Specimen: BLOOD  Result Value Ref Range Status   Specimen Description   Final    BLOOD LEFT ANTECUBITAL Performed at [facility] [address] [place], [place] [address]    Special Requests   Final    BOTTLES DRAWN AEROBIC AND ANAEROBIC Blood Culture adequate volume Performed at [facility] [address], [place], [place] [address]    Culture   Final    NO GROWTH 2 DAYS Performed at [facility] [address] [place], [place] [address]    Report Status PENDING  Incomplete   Resp Panel by RT-PCR (Flu A&B, Covid) Nasopharyngeal Swab     Status: None   Collection Time: 12/23/20  4:04 AM   Specimen: Nasopharyngeal Swab; Nasopharyngeal(NP) swabs in vial transport medium  Result Value Ref Range Status   SARS Coronavirus 2 by RT PCR NEGATIVE NEGATIVE Final    Comment: (NOTE) SARS-CoV-2 target nucleic acids are NOT DETECTED.  The SARS-CoV-2 RNA is generally detectable in upper respiratory specimens during the acute phase of infection. The lowest concentration of SARS-CoV-2 viral copies this assay can detect is 138 copies/mL. A negative result does not preclude SARS-Cov-2 infection and should not be used as the sole basis for treatment or other patient management decisions. A negative result may occur with  improper specimen collection/handling, submission of specimen other than nasopharyngeal swab, presence of viral mutation(s) within the areas targeted by this assay, and inadequate number of viral copies(<138 copies/mL). A negative result must be combined with clinical observations, patient history, and epidemiological information. The expected result is Negative.  Fact Sheet for Patients:  EntrepreneurPulse.com.au  Fact Sheet for Healthcare Providers:  IncredibleEmployment.be  This test is no t yet approved or cleared by the [place] and  has been authorized for detection and/or diagnosis of SARS-CoV-2 by FDA under an Emergency Use Authorization (EUA). This EUA will remain  in effect (meaning this test can be used) for the duration of the COVID-19 declaration under Section 564(b)(1) of the Act, 21 U.S.C.section 360bbb-3(b)(1), unless the authorization is terminated  or revoked sooner.       Influenza A by PCR NEGATIVE NEGATIVE Final   Influenza B by PCR NEGATIVE NEGATIVE Final    Comment: (NOTE) The Xpert Xpress SARS-CoV-2/FLU/RSV plus assay is intended as an aid in the diagnosis of influenza from  Nasopharyngeal swab specimens and should not be used as a sole basis for treatment. Nasal washings and aspirates are unacceptable for Xpert Xpress SARS-CoV-2/FLU/RSV testing.  Fact Sheet for Patients: EntrepreneurPulse.com.au  Fact Sheet for Healthcare Providers: IncredibleEmployment.be  This test is not yet approved or cleared by the [place] FDA and has been authorized for detection and/or diagnosis of SARS-CoV-2 by FDA under an Emergency Use Authorization (EUA). This EUA will remain in effect (meaning this test can be used) for the duration of the COVID-19 declaration under Section 564(b)(1) of the Act, 21 U.S.C. section 360bbb-3(b)(1), unless the authorization is terminated or revoked.  Performed at [facility], [place] [address] [place], [place] [address]      Signed: Marlowe Aschoff Camelia Stelzner  [facility] 12/26/2020, 11:59 AM

## 2020-12-28 LAB — CULTURE, BLOOD (SINGLE)
Culture: NO GROWTH
Special Requests: ADEQUATE

## 2021-10-29 IMAGING — PT NM PET TUM IMG INITIAL (PI) WHOLE BODY
1 of 7 series · 4 of 25 positions shown · non-contrast
Comparison: None.

CLINICAL DATA: Initial treatment strategy for melanoma of right
forearm.

EXAM:
NUCLEAR MEDICINE PET WHOLE BODY
TECHNIQUE: 4.9 mCi F-18 FDG was injected intravenously. Full-ring PET imaging
was performed from the head to foot after the radiotracer. CT data
was obtained and used for attenuation correction and anatomic
localization.
Fasting blood glucose: 91 mg/dl

[Series 4: ct wb 5.0 hd_fov · axial · 5.0mm · 1.22mm/px · z∈[+228,+1176]mm · 4 of 395 slices shown]
[im 79/395  soft-tissue]
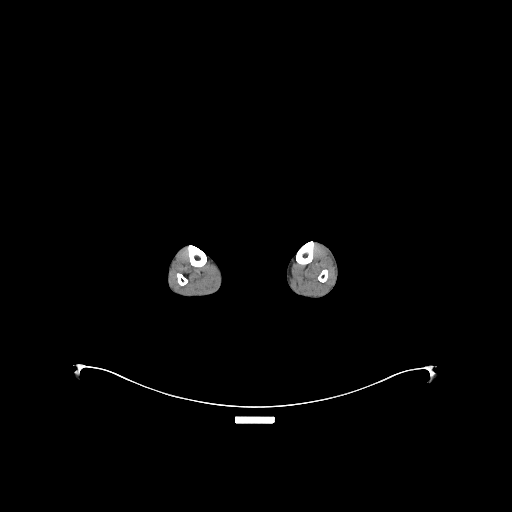
[im 158/395  soft-tissue]
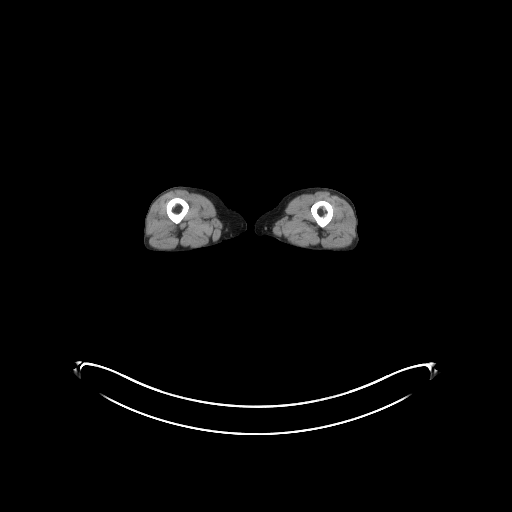
[im 237/395  soft-tissue]
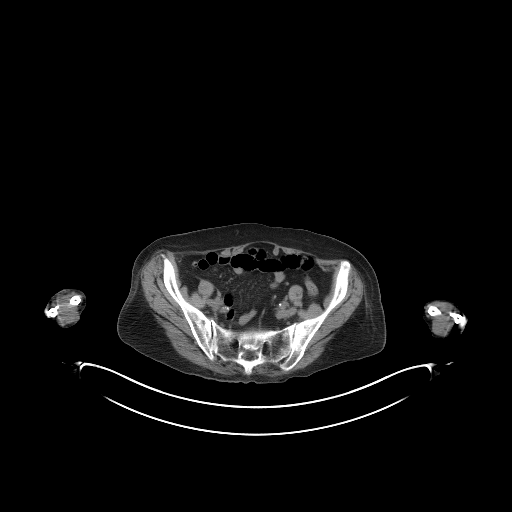
[im 316/395  soft-tissue]
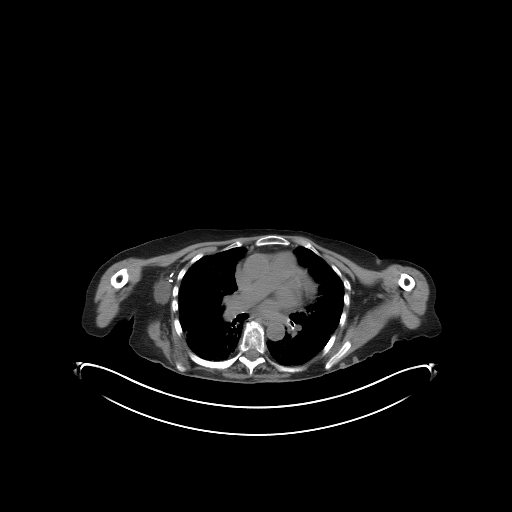

[4 of 25 positions shown; findings below may reference images not displayed]

FINDINGS: Mediastinal blood pool activity: SUV max

HEAD/NECK: No hypermetabolic activity in the scalp. No
hypermetabolic cervical lymph nodes.

Incidental CT findings: none

CHEST: Postoperative change identified within the right axilla.
cm fluid density structure in the right axilla likely represents a
postoperative seroma. No FDG avid axillary, supraclavicular,
mediastinal, or hilar lymph nodes.

Incidental CT findings: Signs of chronic interstitial lung disease
identified with bilateral, peripheral and lower lung zone
predominant subpleural reticulation with traction bronchiectasis.
Possible early subpleural honeycombing also noted. Heart size is
enlarged. Aortic atherosclerosis.

ABDOMEN/PELVIS: No abnormal FDG uptake identified within the liver,
pancreas, spleen, or adrenal glands. No FDG avid abdominopelvic
lymph nodes.

Incidental CT findings: Aortic atherosclerosis. The supra renal
abdominal aorta measures 1.8 cm. Infrarenal abdominal aorta measures
up to 2.9 cm. Large hiatal hernia.

SKELETON: No focal hypermetabolic activity to suggest skeletal
metastasis.

Incidental CT findings: none

EXTREMITIES: There is a focal area of increased radiotracer uptake
along the anterolateral aspect of the left lower leg with SUV max of
4.99. The area of uptake appears superficial along the skin surface.
No corresponding soft tissue mass or nodule identified on the CT
images.

Incidental CT findings: none
IMPRESSION: 1. There are no specific findings identified to suggest FDG avid
melanoma metastasis.
2. Focal area of superficial increased uptake along the
anterolateral aspect of the left lower leg which may reflect
contamination artifact. FDG avid skin lesion however cannot be
excluded and correlation with direct visualization is advised.
3. Chronic interstitial changes identified within both lungs
compatible with chronic interstitial lung disease.
4. Cardiac enlargement and coronary artery calcifications.
5. Large hiatal hernia.

## 2022-07-03 ENCOUNTER — Ambulatory Visit: Payer: Medicare Other | Admitting: Pulmonary Disease

## 2022-07-03 ENCOUNTER — Encounter: Payer: Self-pay | Admitting: Pulmonary Disease

## 2022-07-03 VITALS — BP 116/74 | HR 62 | Ht 60.0 in | Wt 84.3 lb

## 2022-07-03 DIAGNOSIS — J8410 Pulmonary fibrosis, unspecified: Secondary | ICD-10-CM

## 2022-07-03 NOTE — Patient Instructions (Signed)
Your chest x-ray and CT Chest scans show evidence of pulmonary fibrosis.  We will check your oxygen levels while walking today  We will check an overnight oximetry test to check your oxygen levels at night when sleeping.  Follow up in 3 months

## 2022-07-03 NOTE — Progress Notes (Signed)
Synopsis: Referred in March 2024 for shortness of breath by Nicholes Rough, PA  Subjective:   PATIENT ID: Kendra Owens GENDER: female DOB: 1925/10/24, MRN: [id number]  HPI  Chief Complaint  Patient presents with   Consult    Referred by PCP for history of SOB. States she has been more SOB since Christmas 2023.    Kendra Owens is a 89 year old woman, never smoker with history of melanoma followed at [place] who is referred to pulmonary clinic for shortness of breath.   She is accompanied by her daughter and daughter in-law. They report she has had progressive shortness of breath over the past 1 year. Chest radiograph 05/25/22 at [facility] showed interstitial markings in the periphery, moderate hiatal hernia, no pleural effusions. She had NM PET CT Scan in 2021 which showed chronic interstitial changes within both lungs, with basilar predominance of subpleural reticulation and traction bronchiectasis.   She has intermittent cough and denies wheezing. She is limited in her physical activity due to shortness of breath and also her vision issues.   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   Past Medical History:  Diagnosis Date   Arthritis    Cancer ([place])    skin   High cholesterol    Hypothyroidism    Macular degeneration    Thyroid disease      No family history on file.   Social History   Socioeconomic History   Marital status: Married    Spouse name: Not on file   Number of children: Not on file   Years of education: Not on file   Highest education level: Not on file  Occupational History   Not on file  Tobacco Use   Smoking status: Never   Smokeless tobacco: Never  Vaping Use   Vaping Use: Never used  Substance and Sexual Activity   Alcohol use: No   Drug use: No   Sexual activity: Not Currently  Other  Topics Concern   Not on file  Social History Narrative   Not on file   Social Determinants of Health   Financial Resource Strain: Not on file  Food Insecurity: Not on file  Transportation Needs: Not on file  Physical Activity: Not on file  Stress: Not on file  Social Connections: Not on file  Intimate Partner Violence: Not on file     Allergies  Allergen Reactions   Other Rash and Swelling   Penicillins Other (See Comments)    Pt sts she is not allergic, but "we lost a little girl from a penicillin shot", so she does not take it. refused Pt sts she is not allergic, but "we lost a little girl from a penicillin shot", so she does not take it.   Tetanus Toxoids Rash and Swelling     Outpatient Medications Prior to Visit  Medication Sig Dispense Refill   aspirin 81 MG EC tablet Take 81 mg by mouth daily.      ERGOCALCIFEROL PO Take 2 tablets by mouth daily.     Ferrous Sulfate (IRON PO) Take by mouth.     furosemide (LASIX) 20 MG tablet Take 20 mg by mouth daily.     hydrochlorothiazide (MICROZIDE) 12.5 MG capsule Take 12.5 mg by mouth daily.     levothyroxine (SYNTHROID) 50 MCG tablet Take  25-50 mcg by mouth daily. 24mcg daily alternating with 27mcg daily     metoprolol tartrate (LOPRESSOR) 25 MG tablet Take 25 mg by mouth daily.     montelukast (SINGULAIR) 10 MG tablet Take 10 mg by mouth daily.     Multiple Vitamin (MULTIVITAMIN WITH MINERALS) TABS tablet Take 1 tablet by mouth daily. One a day women     Multiple Vitamins-Minerals (PRESERVISION AREDS) CAPS Take 1 capsule by mouth daily.     pantoprazole (PROTONIX) 40 MG tablet Take 40 mg by mouth daily.     Polyethyl Glycol-Propyl Glycol (SYSTANE) 0.4-0.3 % SOLN Place 1 drop into both eyes as needed (dry eyes).     potassium chloride (KLOR-CON M) 10 MEQ tablet Take 10 mEq by mouth once.     pravastatin (PRAVACHOL) 40 MG tablet Take 40 mg by mouth at bedtime.      acetaminophen (TYLENOL) 500 MG tablet Take 500 mg by mouth  every 6 (six) hours as needed for mild pain, fever or headache.     No facility-administered medications prior to visit.    Review of Systems  Constitutional:  Negative for chills, fever, malaise/fatigue and weight loss.  HENT:  Negative for congestion, sinus pain and sore throat.   Eyes: Negative.   Respiratory:  Positive for cough and shortness of breath. Negative for hemoptysis, sputum production and wheezing.   Cardiovascular:  Negative for chest pain, palpitations, orthopnea, claudication and leg swelling.  Gastrointestinal:  Negative for abdominal pain, heartburn, nausea and vomiting.  Genitourinary: Negative.   Musculoskeletal:  Negative for joint pain and myalgias.  Skin:  Negative for rash.  Neurological:  Negative for weakness.  Endo/Heme/Allergies: Negative.   Psychiatric/Behavioral: Negative.     Objective:   Vitals:   07/03/22 1459  BP: 116/74  Pulse: 62  SpO2: 94%  Weight: 84 lb 4.8 oz (38.2 kg)  Height: 5' (1.524 m)     Physical Exam Constitutional:      General: She is not in acute distress.    Appearance: She is not ill-appearing.  HENT:     Head: Normocephalic and atraumatic.  Eyes:     General: No scleral icterus.    Conjunctiva/sclera: Conjunctivae normal.     Pupils: Pupils are equal, round, and reactive to light.  Cardiovascular:     Rate and Rhythm: Normal rate and regular rhythm.     Pulses: Normal pulses.     Heart sounds: Normal heart sounds. No murmur heard. Pulmonary:     Effort: Pulmonary effort is normal.     Breath sounds: Rales present. No wheezing or rhonchi.  Abdominal:     General: Bowel sounds are normal.     Palpations: Abdomen is soft.  Musculoskeletal:     Right lower leg: No edema.     Left lower leg: No edema.  Lymphadenopathy:     Cervical: No cervical adenopathy.  Skin:    General: Skin is warm and dry.  Neurological:     General: No focal deficit present.     Mental Status: She is alert.  Psychiatric:         Mood and Affect: Mood normal.        Behavior: Behavior normal.        Thought Content: Thought content normal.        Judgment: Judgment normal.    CBC    Component Value Date/Time   WBC 6.5 12/26/2020 0353   RBC 3.00 (L) 12/26/2020 0353   HGB  8.6 (L) 12/26/2020 0353   HCT 26.2 (L) 12/26/2020 0353   PLT 215 12/26/2020 0353   MCV 87.3 12/26/2020 0353   MCH 28.7 12/26/2020 0353   MCHC 32.8 12/26/2020 0353   RDW 13.4 12/26/2020 0353   LYMPHSABS 2.0 12/25/2020 0449   MONOABS 0.7 12/25/2020 0449   EOSABS 0.2 12/25/2020 0449   BASOSABS 0.0 12/25/2020 0449      Latest Ref Rng & Units 12/26/2020    3:53 AM 12/25/2020    4:49 AM 12/24/2020    4:32 AM  BMP  Glucose 70 - 99 mg/dL 87  92  94   BUN 8 - 23 mg/dL 5  7  9    Creatinine 0.44 - 1.00 mg/dL 0.71  0.87  0.98   Sodium 135 - 145 mmol/L 136  135  135   Potassium 3.5 - 5.1 mmol/L 3.4  2.6  2.8   Chloride 98 - 111 mmol/L 113  104  104   CO2 22 - 32 mmol/L 23  24  23    Calcium 8.9 - 10.3 mg/dL 7.9  8.2  8.4    Chest imaging: CXR 05/25/22 Stable enlarged cardiac and mediastinal contours.   Again seen are diffuse bilateral peripherally predominant interstitial fibrosis consistent with underlying chronic interstitial lung disease. These are worsened compared to prior study. Upper lobe predominant emphysematous changes are seen. There is no focal consolidation. No pleural effusion or pneumothorax.    The bones and the upper abdomen are unremarkable.   PFT:     No data to display         Labs:  Path:  Echo 06/11/22: LV EF 52-55%. RV systolic function is low normal. RVSP 52mmHg. Mild aortic valve regurgitation. Tricuspid valve is normal.  Heart Catheterization:  Assessment & Plan:   Pulmonary fibrosis (HCC) - Plan: Pulse oximetry, overnight  Discussion: Kendra Owens is a 89 year old woman, never smoker with history of melanoma followed at [place] who is referred to pulmonary clinic for shortness of breath.   She has UIP  pattern on CT Chest consistent with pulmonary fibrosis. We discussed this diagnosis in detail today and reviewed the CT chest scan from 2021. 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 We will not perform inflammatory workup as her history is not concerning for this and she was not wish to be on high risk immunosuppression. We discussed antifibrotic treatment along with the side effect profile, which she does not wish to start treatment.   She has pulmonary hypertension based on recent echo with RVSP of 41mmHg. She is on lasix therapy per cardiology.   On simple walk today her oxygen saturations remained above 88% when walking 1 lap. She did desaturate from 95 to 90%. We will check overnight oximetry on room air.  We discussed her code status and resuscitation wishes, she expressed no desire for intubation or CPR. She will review her living will documents at home.  Follow up in 3 months.   Kendra Jackson, MD [facility] Office: [phone]   Current Outpatient Medications:    aspirin 81 MG EC tablet, Take 81 mg by mouth daily. , Disp: , Rfl:    ERGOCALCIFEROL PO, Take 2 tablets by mouth daily., Disp: , Rfl:    Ferrous Sulfate (IRON PO), Take by mouth., Disp: , Rfl:    furosemide (LASIX) 20 MG tablet, Take 20 mg by  mouth daily., Disp: , Rfl:    hydrochlorothiazide (MICROZIDE) 12.5 MG capsule, Take 12.5 mg by mouth daily., Disp: , Rfl:    levothyroxine (SYNTHROID) 50 MCG tablet, Take 25-50 mcg by mouth daily. 28mcg daily alternating with 77mcg daily, Disp: , Rfl:    metoprolol tartrate (LOPRESSOR) 25 MG tablet, Take 25 mg by mouth daily., Disp: , Rfl:    montelukast (SINGULAIR) 10 MG tablet, Take 10 mg by mouth daily., Disp: , Rfl:    Multiple Vitamin (MULTIVITAMIN WITH MINERALS) TABS tablet, Take 1 tablet by mouth daily. One a day women, Disp: , Rfl:     Multiple Vitamins-Minerals (PRESERVISION AREDS) CAPS, Take 1 capsule by mouth daily., Disp: , Rfl:    pantoprazole (PROTONIX) 40 MG tablet, Take 40 mg by mouth daily., Disp: , Rfl:    Polyethyl Glycol-Propyl Glycol (SYSTANE) 0.4-0.3 % SOLN, Place 1 drop into both eyes as needed (dry eyes)., Disp: , Rfl:    potassium chloride (KLOR-CON M) 10 MEQ tablet, Take 10 mEq by mouth once., Disp: , Rfl:    pravastatin (PRAVACHOL) 40 MG tablet, Take 40 mg by mouth at bedtime. , Disp: , Rfl:

## 2022-07-19 ENCOUNTER — Telehealth: Payer: Self-pay | Admitting: Pulmonary Disease

## 2022-07-19 DIAGNOSIS — R7981 Abnormal blood-gas level: Secondary | ICD-10-CM

## 2022-07-19 NOTE — Telephone Encounter (Signed)
PT's relative, Jackelyn Poling, calling to check on ONO results. Pls call @ [phone]

## 2022-07-19 NOTE — Telephone Encounter (Signed)
Do you happen to have ONO results yet?  If not I will call the company and get them fax to you.  Thank you

## 2022-07-19 NOTE — Telephone Encounter (Signed)
Called and spoke to patients daughter and she states that she is agreeable to oxygen. Orders placed for night time oxygen. Nothing further needed

## 2022-07-19 NOTE — Telephone Encounter (Signed)
Patient's ONO showed an SpO2 of less 88% for 3 hours 41min. She does qualify for supplemental oxygen. Recommend she start on 2L of O2 when sleeping.   Please place orders for nocturnal oxygen if ok with patient/family.  Thanks, JD

## 2022-08-01 ENCOUNTER — Telehealth: Payer: Self-pay | Admitting: Pulmonary Disease

## 2022-08-01 DIAGNOSIS — R7981 Abnormal blood-gas level: Secondary | ICD-10-CM

## 2022-08-01 NOTE — Telephone Encounter (Signed)
PT daughter calling. They just got O2 and feel mom needs a humidifier. Pls call Cherolyn Behrle @ [phone]

## 2022-08-01 NOTE — Telephone Encounter (Signed)
Called and spoke to patients wife and verified that patient was wanting humidification. Nothing furhter needed

## 2023-01-15 DEATH — deceased
